# Patient Record
Sex: Male | Born: 1960
Health system: Southern US, Community
[De-identification: ages and names within clinical notes are randomized; demographics above are authoritative.]

## PROBLEM LIST (undated history)

## (undated) DIAGNOSIS — T7840XA Allergy, unspecified, initial encounter: Secondary | ICD-10-CM

## (undated) DIAGNOSIS — G4730 Sleep apnea, unspecified: Secondary | ICD-10-CM

## (undated) DIAGNOSIS — K219 Gastro-esophageal reflux disease without esophagitis: Secondary | ICD-10-CM

## (undated) DIAGNOSIS — M1990 Unspecified osteoarthritis, unspecified site: Secondary | ICD-10-CM

## (undated) DIAGNOSIS — I10 Essential (primary) hypertension: Secondary | ICD-10-CM

## (undated) DIAGNOSIS — Z8709 Personal history of other diseases of the respiratory system: Secondary | ICD-10-CM

## (undated) DIAGNOSIS — F419 Anxiety disorder, unspecified: Secondary | ICD-10-CM

## (undated) DIAGNOSIS — F32A Depression, unspecified: Secondary | ICD-10-CM

## (undated) DIAGNOSIS — R0600 Dyspnea, unspecified: Secondary | ICD-10-CM

## (undated) HISTORY — PX: TONSILLECTOMY: SUR1361

## (undated) HISTORY — PX: JOINT REPLACEMENT: SHX530

## (undated) HISTORY — PX: LUMBAR FUSION: SHX111

## (undated) HISTORY — PX: BACK SURGERY: SHX140

## (undated) HISTORY — DX: Allergy, unspecified, initial encounter: T78.40XA

## (undated) HISTORY — DX: Gastro-esophageal reflux disease without esophagitis: K21.9

## (undated) HISTORY — DX: Depression, unspecified: F32.A

---
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
## 2008-07-24 ENCOUNTER — Encounter: Admission: RE | Admit: 2008-07-24 | Discharge: 2008-07-24 | Payer: Self-pay | Admitting: Neurosurgery

## 2008-09-04 ENCOUNTER — Ambulatory Visit (HOSPITAL_COMMUNITY): Admission: RE | Admit: 2008-09-04 | Discharge: 2008-09-05 | Payer: Self-pay | Admitting: Neurosurgery

## 2009-01-05 ENCOUNTER — Encounter: Admission: RE | Admit: 2009-01-05 | Discharge: 2009-01-05 | Payer: Self-pay | Admitting: Neurosurgery

## 2010-05-03 LAB — BASIC METABOLIC PANEL
BUN: 15 mg/dL (ref 6–23)
CO2: 27 mEq/L (ref 19–32)
Calcium: 10.3 mg/dL (ref 8.4–10.5)
Chloride: 106 mEq/L (ref 96–112)
Creatinine, Ser: 1.13 mg/dL (ref 0.4–1.5)
GFR calc non Af Amer: >60 mL/min (ref >60)
Glucose, Bld: 110 mg/dL — ABNORMAL HIGH (ref 70–99)
Potassium: 4.8 mEq/L (ref 3.5–5.1)
Sodium: 140 mEq/L (ref 135–145)

## 2010-05-03 LAB — CBC
HCT: 45.9 % (ref 39.0–52.0)
Hemoglobin: 15.8 g/dL (ref 13.0–17.0)
MCHC: 34.5 g/dL (ref 30.0–36.0)
MCV: 97.4 fL (ref 78.0–100.0)
Platelets: 213 10*3/uL (ref 150–400)
RBC: 4.71 MIL/uL (ref 4.22–5.81)
RDW: 13.5 % (ref 11.5–15.5)
WBC: 7.4 10*3/uL (ref 4.0–10.5)

## 2010-06-09 NOTE — Op Note (Signed)
Barry Roberson, Barry Roberson               ACCOUNT NO.:  [id number]   MEDICAL RECORD NO.:  [id number]          PATIENT TYPE:  OIB   LOCATION:  3536                         FACILITY:  [facility]   PHYSICIAN:  Hewitt Shorts, M.D.DATE OF BIRTH:  25-Sep-1960   DATE OF PROCEDURE:  09/04/2008  DATE OF DISCHARGE:                               OPERATIVE REPORT   PREOPERATIVE DIAGNOSES:  Left L5-S1 lumbar disk herniation, lumbar  degenerative disk disease, lumbar spondylosis, and lumbar radiculopathy.   POSTOPERATIVE DIAGNOSES:  Left L5-S1 lumbar disk herniation, lumbar  degenerative disk disease, lumbar spondylosis and lumbar radiculopathy.   PROCEDURE:  Left L5-S1 lumbar laminotomy and microdiskectomy with  microdissection.   SURGEON:  Hewitt Shorts, M.D.   ASSISTANT:  Nelia Shi. Webb Silversmith, RN and Clydene Fake, M.D.   ANESTHESIA:  General endotracheal.   INDICATION:  The patient is a 50 year old man who presented with a left  lumbar radiculopathy found to be secondary to a left L5-S1 lumbar disk  herniation, superimposed upon underlying degenerative disk disease and  spondylosis.  A decision was made to proceed with elective laminotomy  and microdiskectomy.   PROCEDURE:  The patient was brought to the operating room and placed  under general endotracheal anesthesia.  The patient was turned to a  prone position.  Lumbar region was prepped with Betadine soap and  solution and draped in a sterile fashion.  The midline was infiltrated  with local anesthetic with epinephrine and x-ray was taken.  The L5-S1  level identified and a midline incision was made over the L5-S1 level  and carried down through the subcutaneous tissue.  Bipolar cautery and  electrocautery were used to maintain hemostasis.  Dissection was carried  down to the lumbar fascia, which was incised on the left side of the  midline.  The paraspinal muscles were dissected from the spinous process  and lamina in a subperiosteal  fashion.  A self-retaining retractor was  placed and the L5-S1 interlaminar space was identified and an x-ray was  taken to confirm the localization.   The operating microscope was then draped and brought to the field to  provide additional navigation, illumination, and visualization, and the  remainder of the decompression was performed using microdissection and  microsurgical technique.   Laminotomy was performed using the Stryker high-speed drill along with  Kerrison punches.  The ligamentum flavum was carefully removed and we  identified the thecal sac and left S1 nerve root.  A foraminotomy was  performed through the left S1 nerve root and then we were able to gently  retract the thecal sac and nerve root immediately.  A disk herniation  was found with a fragment that had herniated caudally behind the body of  S1.  This was mobilized and removed and the rent in the annulus through  which it had herniated was identified.  We then further opened the  annulus and entered into the disk space and continued the diskectomy,  continuing to remove extensive degenerated disk material.  A thorough  diskectomy was performed removing all loose disk material from both the  disk space and the epidural space, and good decompression of the thecal  sac and nerve root was achieved.  Once decompression was completed,  hemostasis was established with a use of bipolar cautery and then we  instilled 2 mL of fentanyl and 80 mg of Depo-Medrol into the epidural  space and proceeded with closure.  Lumbar fascia was closed with  interrupted undyed #1 Vicryl sutures.  The subcutaneous and subcuticular  were closed with interrupted inverted 2-0 undyed Vicryl sutures.  The  skin was approximated with Dermabond.  The procedure was tolerated well.  The estimated blood loss was 50 mL.  Sponge and needle count were  correct.  Following surgery, the patient was returned back to the supine  position to be reversed  from the anesthetic, extubated, and transferred  to the recovery room for further care.      Hewitt Shorts, M.D.  Electronically Signed     RWN/MEDQ  D:  09/04/2008  T:  09/05/2008  Job:  [id number]

## 2011-03-30 ENCOUNTER — Emergency Department (HOSPITAL_COMMUNITY): Admission: EM | Admit: 2011-03-30 | Discharge: 2011-03-30 | Discharge status: Against Medical Advice | Payer: Self-pay

## 2015-09-07 NOTE — H&P (Signed)
TOTAL HIP ADMISSION H&P  Patient is admitted for left total hip arthroplasty, anterior approach.  Subjective:  Chief Complaint: Left hip primary OA / pain  HPI: Barry Roberson, 55 y.o. male, has a history of pain and functional disability in the left hip(s) due to arthritis and patient has failed non-surgical conservative treatments for greater than 12 weeks to include NSAID's and/or analgesics, corticosteriod injections and activity modification.  Onset of symptoms was gradual starting 3-4 years ago with gradually worsening course since that time.The patient noted no past surgery on the left hip(s).  Patient currently rates pain in the left hip at 9 out of 10 with activity. Patient has night pain, worsening of pain with activity and weight bearing, trendelenberg gait, pain that interfers with activities of daily living and pain with passive range of motion. Patient has evidence of periarticular osteophytes and joint space narrowing by imaging studies. This condition presents safety issues increasing the risk of falls. There is no current active infection.   Risks, benefits and expectations were discussed with the patient.  Risks including but not limited to the risk of anesthesia, blood clots, nerve damage, blood vessel damage, failure of the prosthesis, infection and up to and including death.  Patient understand the risks, benefits and expectations and wishes to proceed with surgery.   PCP: No PCP Per Patient  D/C Plans:      Home  Post-op Meds:       No Rx given  Tranexamic Acid:      To be given - IV   Decadron:      Is to be given  FYI:     ASA  Norco      Past Medical History:  Diagnosis Date  . History of bronchitis    10 years ago     Past Surgical History:  Procedure Laterality Date  . BACK SURGERY     08/2008 secondary to ruptured L3   . TONSILLECTOMY      No prescriptions prior to admission.   Not on File   Social History  Substance Use Topics  . Smoking status:  Current Every Day Smoker    Packs/day: 0.50    Years: 30.00    Types: Cigarettes  . Smokeless tobacco: Never Used  . Alcohol use No       Review of Systems  Constitutional: Negative.   HENT: Negative.   Eyes: Negative.   Respiratory: Negative.   Cardiovascular: Negative.   Gastrointestinal: Negative.   Genitourinary: Negative.   Musculoskeletal: Positive for joint pain.  Skin: Negative.   Neurological: Negative.   Endo/Heme/Allergies: Negative.   Psychiatric/Behavioral: Negative.     Objective:  Physical Exam  Constitutional: He is oriented to person, place, and time. He appears well-developed.  HENT:  Head: Normocephalic.  Eyes: Pupils are equal, round, and reactive to light.  Neck: Neck supple. No JVD present. No tracheal deviation present. No thyromegaly present.  Cardiovascular: Normal rate, regular rhythm, normal heart sounds and intact distal pulses.   Respiratory: Effort normal and breath sounds normal. No respiratory distress. He has no wheezes.  GI: Soft. There is no tenderness. There is no guarding.  Musculoskeletal:       Left hip: He exhibits decreased range of motion, decreased strength, tenderness and bony tenderness. He exhibits no swelling, no deformity and no laceration.  Lymphadenopathy:    He has no cervical adenopathy.  Neurological: He is alert and oriented to person, place, and time.  Skin: Skin is  warm and dry.  Psychiatric: He has a normal mood and affect.      Imaging Review Plain radiographs demonstrate severe degenerative joint disease of the left hip(s). The bone quality appears to be good for age and reported activity level.  Assessment/Plan:  End stage arthritis, left hip(s)  The patient history, physical examination, clinical judgement of the provider and imaging studies are consistent with end stage degenerative joint disease of the left hip(s) and total hip arthroplasty is deemed medically necessary. The treatment options including  medical management, injection therapy, arthroscopy and arthroplasty were discussed at length. The risks and benefits of total hip arthroplasty were presented and reviewed. The risks due to aseptic loosening, infection, stiffness, dislocation/subluxation,  thromboembolic complications and other imponderables were discussed.  The patient acknowledged the explanation, agreed to proceed with the plan and consent was signed. Patient is being admitted for inpatient treatment for surgery, pain control, PT, OT, prophylactic antibiotics, VTE prophylaxis, progressive ambulation and ADL's and discharge planning.The patient is planning to be discharged home.    [place] Pugh Nabila Albarracin   PA-C  09/11/2015, 7:42 AM

## 2015-09-10 ENCOUNTER — Encounter (HOSPITAL_COMMUNITY)
Admission: RE | Admit: 2015-09-10 | Discharge: 2015-09-10 | Disposition: A | Discharge status: Home / Self Care | Payer: Commercial Managed Care - HMO | Source: Ambulatory Visit | Attending: Orthopedic Surgery | Admitting: Orthopedic Surgery

## 2015-09-10 ENCOUNTER — Encounter (HOSPITAL_COMMUNITY): Payer: Self-pay

## 2015-09-10 DIAGNOSIS — M1612 Unilateral primary osteoarthritis, left hip: Secondary | ICD-10-CM | POA: Diagnosis not present

## 2015-09-10 DIAGNOSIS — Z01812 Encounter for preprocedural laboratory examination: Secondary | ICD-10-CM | POA: Diagnosis not present

## 2015-09-10 DIAGNOSIS — Z0183 Encounter for blood typing: Secondary | ICD-10-CM | POA: Diagnosis not present

## 2015-09-10 HISTORY — DX: Personal history of other diseases of the respiratory system: Z87.09

## 2015-09-10 LAB — ABO/RH: ABO/RH(D): O POS

## 2015-09-10 LAB — BASIC METABOLIC PANEL
ANION GAP: 6 (ref 5–15)
BUN: 19 mg/dL (ref 6–20)
CHLORIDE: 106 mmol/L (ref 101–111)
CO2: 26 mmol/L (ref 22–32)
CREATININE: 1.09 mg/dL (ref 0.61–1.24)
Calcium: 9.2 mg/dL (ref 8.9–10.3)
GFR calc non Af Amer: >60 mL/min (ref >60)
Glucose, Bld: 99 mg/dL (ref 65–99)
POTASSIUM: 4.7 mmol/L (ref 3.5–5.1)
SODIUM: 138 mmol/L (ref 135–145)

## 2015-09-10 LAB — SURGICAL PCR SCREEN
MRSA, PCR: NEGATIVE
Staphylococcus aureus: NEGATIVE

## 2015-09-10 LAB — CBC
HEMATOCRIT: 41.6 % (ref 39.0–52.0)
HEMOGLOBIN: 14.2 g/dL (ref 13.0–17.0)
MCH: 32.2 pg (ref 26.0–34.0)
MCHC: 34.1 g/dL (ref 30.0–36.0)
MCV: 94.3 fL (ref 78.0–100.0)
PLATELETS: 193 10*3/uL (ref 150–400)
RBC: 4.41 MIL/uL (ref 4.22–5.81)
RDW: 13 % (ref 11.5–15.5)
WBC: 6.1 10*3/uL (ref 4.0–10.5)

## 2015-09-10 LAB — PROTIME-INR
INR: 0.93
Prothrombin Time: 12.5 seconds (ref 11.4–15.2)

## 2015-09-10 NOTE — Patient Instructions (Signed)
Barry Roberson  09/10/2015   Your procedure is scheduled on: Tuesday September 16, 2015  Report to [facility]  Entrance take [place]  elevators to 3rd floor to  [place] at 8:30 AM.  Call this number if you have problems the morning of surgery [phone]   Remember: ONLY 1 PERSON MAY GO WITH YOU TO SHORT STAY TO GET  READY MORNING OF [place].  Do not eat food or drink liquids :After Midnight.     Take these medicines the morning of surgery with A SIP OF WATER: NONE                               You may not have any metal on your body including hair pins and              piercings  Do not wear jewelry, lotions, powders or colognes, deodorant                         Men may shave face and neck.   Do not bring valuables to the hospital. [place].  Contacts, dentures or bridgework may not be worn into surgery.  Leave suitcase in the car. After surgery it may be brought to your room.    Special Instructions: NO SMOKING 24 HOURS PRIOR TO SURGICAL PROCEDURE DATE              Please read over the following fact sheets you were given:MRSA INFORMATION SHEET; INCENTIVE SPIROMETER; BLOOD TRANSFUSION INFORMATION SHEET  _____________________________________________________________________             [facility] - Preparing for Surgery Before surgery, you can play an important role.  Because skin is not sterile, your skin needs to be as free of germs as possible.  You can reduce the number of germs on your skin by washing with CHG (chlorahexidine gluconate) soap before surgery.  CHG is an antiseptic cleaner which kills germs and bonds with the skin to continue killing germs even after washing. Please DO NOT use if you have an allergy to CHG or antibacterial soaps.  If your skin becomes reddened/irritated stop using the CHG and inform your nurse when you arrive at Short Stay. Do not shave (including legs and  underarms) for at least 48 hours prior to the first CHG shower.  You may shave your face/neck. Please follow these instructions carefully:  1.  Shower with CHG Soap the night before surgery and the  morning of Surgery.  2.  If you choose to wash your hair, wash your hair first as usual with your  normal  shampoo.  3.  After you shampoo, rinse your hair and body thoroughly to remove the  shampoo.                           4.  Use CHG as you would any other liquid soap.  You can apply chg directly  to the skin and wash                       Gently with a scrungie or clean washcloth.  5.  Apply the CHG Soap to your body ONLY FROM THE NECK DOWN.   Do not use on face/ open                           Wound or open sores. Avoid contact with eyes, ears mouth and genitals (private parts).                       Wash face,  Genitals (private parts) with your normal soap.             6.  Wash thoroughly, paying special attention to the area where your surgery  will be performed.  7.  Thoroughly rinse your body with warm water from the neck down.  8.  DO NOT shower/wash with your normal soap after using and rinsing off  the CHG Soap.                9.  Pat yourself dry with a clean towel.            10.  Wear clean pajamas.            11.  Place clean sheets on your bed the night of your first shower and do not  sleep with pets. Day of Surgery : Do not apply any lotions/deodorants the morning of surgery.  Please wear clean clothes to the hospital/surgery center.  FAILURE TO FOLLOW THESE INSTRUCTIONS MAY RESULT IN THE CANCELLATION OF YOUR SURGERY PATIENT SIGNATURE_________________________________  NURSE SIGNATURE__________________________________  ________________________________________________________________________   Adam Phenix  An incentive spirometer is a tool that can help keep your lungs clear and active. This tool measures how well you are filling your lungs with each breath. Taking  long deep breaths may help reverse or decrease the chance of developing breathing (pulmonary) problems (especially infection) following:  A long period of time when you are unable to move or be active. BEFORE THE PROCEDURE   If the spirometer includes an indicator to show your best effort, your nurse or respiratory therapist will set it to a desired goal.  If possible, sit up straight or lean slightly forward. Try not to slouch.  Hold the incentive spirometer in an upright position. INSTRUCTIONS FOR USE  1. Sit on the edge of your bed if possible, or sit up as far as you can in bed or on a chair. 2. Hold the incentive spirometer in an upright position. 3. Breathe out normally. 4. Place the mouthpiece in your mouth and seal your lips tightly around it. 5. Breathe in slowly and as deeply as possible, raising the piston or the ball toward the top of the column. 6. Hold your breath for 3-5 seconds or for as long as possible. Allow the piston or ball to fall to the bottom of the column. 7. Remove the mouthpiece from your mouth and breathe out normally. 8. Rest for a few seconds and repeat Steps 1 through 7 at least 10 times every 1-2 hours when you are awake. Take your time and take a few normal breaths between deep breaths. 9. The spirometer may include an indicator to show your best effort. Use the indicator as a goal to work toward during each repetition. 10. After each set of 10 deep breaths, practice coughing to be sure your lungs are clear. If you have an incision (the cut made at the time of surgery), support your incision when coughing by placing a pillow or  rolled up towels firmly against it. Once you are able to get out of bed, walk around indoors and cough well. You may stop using the incentive spirometer when instructed by your caregiver.  RISKS AND COMPLICATIONS  Take your time so you do not get dizzy or light-headed.  If you are in pain, you may need to take or ask for pain  medication before doing incentive spirometry. It is harder to take a deep breath if you are having pain. AFTER USE  Rest and breathe slowly and easily.  It can be helpful to keep track of a log of your progress. Your caregiver can provide you with a simple table to help with this. If you are using the spirometer at home, follow these instructions: [place] IF:   You are having difficultly using the spirometer.  You have trouble using the spirometer as often as instructed.  Your pain medication is not giving enough relief while using the spirometer.  You develop fever of 100.5 F (38.1 C) or higher. SEEK IMMEDIATE MEDICAL CARE IF:   You cough up bloody sputum that had not been present before.  You develop fever of 102 F (38.9 C) or greater.  You develop worsening pain at or near the incision site. MAKE SURE YOU:   Understand these instructions.  Will watch your condition.  Will get help right away if you are not doing well or get worse. Document Released: 05/24/2006 Document Revised: 04/05/2011 Document Reviewed: 07/25/2006 Exit[employer] Patient Information 2014 [employer], [place].   ________________________________________________________________________  WHAT IS A BLOOD TRANSFUSION? Blood Transfusion Information  A transfusion is the replacement of blood or some of its parts. Blood is made up of multiple cells which provide different functions.  Red blood cells carry oxygen and are used for blood loss replacement.  White blood cells fight against infection.  Platelets control bleeding.  Plasma helps clot blood.  Other blood products are available for specialized needs, such as hemophilia or other clotting disorders. BEFORE THE TRANSFUSION  Who gives blood for transfusions?   Healthy volunteers who are fully evaluated to make sure their blood is safe. This is blood bank blood. Transfusion therapy is the safest it has ever been in the practice of medicine.  Before blood is taken from a donor, a complete history is taken to make sure that person has no history of diseases nor engages in risky social behavior (examples are intravenous drug use or sexual activity with multiple partners). The donor's travel history is screened to minimize risk of transmitting infections, such as malaria. The donated blood is tested for signs of infectious diseases, such as HIV and hepatitis. The blood is then tested to be sure it is compatible with you in order to minimize the chance of a transfusion reaction. If you or a relative donates blood, this is often done in anticipation of surgery and is not appropriate for emergency situations. It takes many days to process the donated blood. RISKS AND COMPLICATIONS Although transfusion therapy is very safe and saves many lives, the main dangers of transfusion include:   Getting an infectious disease.  Developing a transfusion reaction. This is an allergic reaction to something in the blood you were given. Every precaution is taken to prevent this. The decision to have a blood transfusion has been considered carefully by your caregiver before blood is given. Blood is not given unless the benefits outweigh the risks. AFTER THE TRANSFUSION  Right after receiving a blood transfusion, you will usually  feel much better and more energetic. This is especially true if your red blood cells have gotten low (anemic). The transfusion raises the level of the red blood cells which carry oxygen, and this usually causes an energy increase.  The nurse administering the transfusion will monitor you carefully for complications. HOME CARE INSTRUCTIONS  No special instructions are needed after a transfusion. You may find your energy is better. Speak with your caregiver about any limitations on activity for underlying diseases you may have. SEEK MEDICAL CARE IF:   Your condition is not improving after your transfusion.  You develop redness or  irritation at the intravenous (IV) site. SEEK IMMEDIATE MEDICAL CARE IF:  Any of the following symptoms occur over the next 12 hours:  Shaking chills.  You have a temperature by mouth above 102 F (38.9 C), not controlled by medicine.  Chest, back, or muscle pain.  People around you feel you are not acting correctly or are confused.  Shortness of breath or difficulty breathing.  Dizziness and fainting.  You get a rash or develop hives.  You have a decrease in urine output.  Your urine turns a dark color or changes to pink, red, or brown. Any of the following symptoms occur over the next 10 days:  You have a temperature by mouth above 102 F (38.9 C), not controlled by medicine.  Shortness of breath.  Weakness after normal activity.  The white part of the eye turns yellow (jaundice).  You have a decrease in the amount of urine or are urinating less often.  Your urine turns a dark color or changes to pink, red, or brown. Document Released: 01/09/2000 Document Revised: 04/05/2011 Document Reviewed: 08/28/2007 [facility] Patient Information 2014 [place], [place].  _______________________________________________________________________

## 2015-09-16 ENCOUNTER — Inpatient Hospital Stay (HOSPITAL_COMMUNITY): Payer: Commercial Managed Care - HMO | Admitting: Anesthesiology

## 2015-09-16 ENCOUNTER — Encounter (HOSPITAL_COMMUNITY)
Admission: RE | Disposition: A | Discharge status: Home Health | Payer: Self-pay | Source: Ambulatory Visit | Attending: Orthopedic Surgery

## 2015-09-16 ENCOUNTER — Inpatient Hospital Stay (HOSPITAL_COMMUNITY): Payer: Commercial Managed Care - HMO

## 2015-09-16 ENCOUNTER — Encounter (HOSPITAL_COMMUNITY): Payer: Self-pay | Admitting: *Deleted

## 2015-09-16 ENCOUNTER — Inpatient Hospital Stay (HOSPITAL_COMMUNITY)
Admission: RE | Admit: 2015-09-16 | Discharge: 2015-09-17 | DRG: 470 | Disposition: A | Discharge status: Home Health | Payer: Commercial Managed Care - HMO | Source: Ambulatory Visit | Attending: Orthopedic Surgery | Admitting: Orthopedic Surgery

## 2015-09-16 DIAGNOSIS — Z96642 Presence of left artificial hip joint: Secondary | ICD-10-CM

## 2015-09-16 DIAGNOSIS — F17210 Nicotine dependence, cigarettes, uncomplicated: Secondary | ICD-10-CM | POA: Diagnosis present

## 2015-09-16 DIAGNOSIS — M1612 Unilateral primary osteoarthritis, left hip: Principal | ICD-10-CM | POA: Diagnosis present

## 2015-09-16 DIAGNOSIS — M25552 Pain in left hip: Secondary | ICD-10-CM | POA: Diagnosis present

## 2015-09-16 HISTORY — PX: TOTAL HIP ARTHROPLASTY: SHX124

## 2015-09-16 LAB — TYPE AND SCREEN
ABO/RH(D): O POS
Antibody Screen: NEGATIVE

## 2015-09-16 SURGERY — ARTHROPLASTY, HIP, TOTAL, ANTERIOR APPROACH
Anesthesia: Spinal | Site: Hip | Laterality: Left

## 2015-09-16 MED ORDER — SODIUM CHLORIDE 0.9 % IV SOLN
INTRAVENOUS | Status: DC
  Administered 2015-09-16 – 2015-09-17 (×2): via INTRAVENOUS

## 2015-09-16 MED ORDER — ONDANSETRON HCL 4 MG/2ML IJ SOLN
INTRAMUSCULAR | Status: AC
  Filled 2015-09-16: qty 2

## 2015-09-16 MED ORDER — DEXAMETHASONE SODIUM PHOSPHATE 10 MG/ML IJ SOLN
10.0000 mg | Freq: Once | INTRAMUSCULAR | Status: AC
  Administered 2015-09-16: 10 mg via INTRAVENOUS

## 2015-09-16 MED ORDER — HYDROCODONE-ACETAMINOPHEN 7.5-325 MG PO TABS
1.0000 | ORAL_TABLET | ORAL | Status: DC | PRN
  Administered 2015-09-16: 1 via ORAL
  Administered 2015-09-16: 2 via ORAL
  Administered 2015-09-16: 1 via ORAL
  Administered 2015-09-17 (×4): 2 via ORAL
  Filled 2015-09-16 (×3): qty 2
  Filled 2015-09-16: qty 1
  Filled 2015-09-16 (×2): qty 2
  Filled 2015-09-16: qty 1
  Filled 2015-09-16: qty 2

## 2015-09-16 MED ORDER — OXYCODONE HCL 5 MG PO TABS: 5.0000 mg | ORAL_TABLET | Freq: Once | ORAL | Status: DC | PRN

## 2015-09-16 MED ORDER — HYDROMORPHONE HCL 1 MG/ML IJ SOLN
0.5000 mg | INTRAMUSCULAR | Status: DC | PRN
  Administered 2015-09-16: 1 mg via INTRAVENOUS
  Filled 2015-09-16 (×2): qty 1

## 2015-09-16 MED ORDER — CEFAZOLIN IN D5W 1 GM/50ML IV SOLN
1.0000 g | Freq: Four times a day (QID) | INTRAVENOUS | Status: AC
  Administered 2015-09-16 – 2015-09-17 (×2): 1 g via INTRAVENOUS
  Filled 2015-09-16 (×2): qty 50

## 2015-09-16 MED ORDER — ASPIRIN 81 MG PO CHEW: 81.0000 mg | CHEWABLE_TABLET | Freq: Two times a day (BID) | ORAL | 0 refills | Status: DC

## 2015-09-16 MED ORDER — CEFAZOLIN SODIUM-DEXTROSE 2-4 GM/100ML-% IV SOLN
INTRAVENOUS | Status: AC
  Filled 2015-09-16: qty 100

## 2015-09-16 MED ORDER — FENTANYL CITRATE (PF) 100 MCG/2ML IJ SOLN
INTRAMUSCULAR | Status: DC | PRN
  Administered 2015-09-16: 50 ug via INTRAVENOUS

## 2015-09-16 MED ORDER — DIPHENHYDRAMINE HCL 12.5 MG/5ML PO ELIX: 12.5000 mg | ORAL_SOLUTION | ORAL | Status: DC | PRN

## 2015-09-16 MED ORDER — POLYETHYLENE GLYCOL 3350 17 G PO PACK
17.0000 g | PACK | Freq: Two times a day (BID) | ORAL | Status: DC
  Administered 2015-09-16 – 2015-09-17 (×2): 17 g via ORAL
  Filled 2015-09-16 (×2): qty 1

## 2015-09-16 MED ORDER — ALUM & MAG HYDROXIDE-SIMETH 200-200-20 MG/5ML PO SUSP: 30.0000 mL | ORAL | Status: DC | PRN

## 2015-09-16 MED ORDER — HYDROMORPHONE HCL 1 MG/ML IJ SOLN
INTRAMUSCULAR | Status: AC
  Filled 2015-09-16: qty 1

## 2015-09-16 MED ORDER — METHOCARBAMOL 500 MG PO TABS: 500.0000 mg | ORAL_TABLET | Freq: Four times a day (QID) | ORAL | 0 refills | Status: DC | PRN

## 2015-09-16 MED ORDER — MIDAZOLAM HCL 5 MG/5ML IJ SOLN
INTRAMUSCULAR | Status: DC | PRN
  Administered 2015-09-16: 2 mg via INTRAVENOUS

## 2015-09-16 MED ORDER — FERROUS SULFATE 325 (65 FE) MG PO TABS
325.0000 mg | ORAL_TABLET | Freq: Two times a day (BID) | ORAL | Status: DC
  Administered 2015-09-16 – 2015-09-17 (×2): 325 mg via ORAL
  Filled 2015-09-16 (×2): qty 1

## 2015-09-16 MED ORDER — DEXTROSE 5 % IV SOLN
INTRAVENOUS | Status: DC | PRN
  Administered 2015-09-16: 50 ug/min via INTRAVENOUS

## 2015-09-16 MED ORDER — HYDROCODONE-ACETAMINOPHEN 7.5-325 MG PO TABS: 1.0000 | ORAL_TABLET | ORAL | 0 refills | Status: DC | PRN

## 2015-09-16 MED ORDER — PROPOFOL 10 MG/ML IV BOLUS
INTRAVENOUS | Status: DC | PRN
  Administered 2015-09-16: 30 mg via INTRAVENOUS

## 2015-09-16 MED ORDER — ASPIRIN 81 MG PO CHEW
81.0000 mg | CHEWABLE_TABLET | Freq: Two times a day (BID) | ORAL | Status: DC
  Administered 2015-09-16 – 2015-09-17 (×2): 81 mg via ORAL
  Filled 2015-09-16 (×2): qty 1

## 2015-09-16 MED ORDER — METHOCARBAMOL 1000 MG/10ML IJ SOLN
500.0000 mg | Freq: Four times a day (QID) | INTRAVENOUS | Status: DC | PRN
  Administered 2015-09-16: 500 mg via INTRAVENOUS
  Filled 2015-09-16: qty 550
  Filled 2015-09-16: qty 5

## 2015-09-16 MED ORDER — OXYCODONE HCL 5 MG/5ML PO SOLN
5.0000 mg | Freq: Once | ORAL | Status: DC | PRN
  Filled 2015-09-16: qty 5

## 2015-09-16 MED ORDER — PHENOL 1.4 % MT LIQD: 1.0000 | OROMUCOSAL | Status: DC | PRN

## 2015-09-16 MED ORDER — PROMETHAZINE HCL 25 MG/ML IJ SOLN: 6.2500 mg | INTRAMUSCULAR | Status: DC | PRN

## 2015-09-16 MED ORDER — HYDROMORPHONE HCL 1 MG/ML IJ SOLN
0.2500 mg | INTRAMUSCULAR | Status: DC | PRN
  Administered 2015-09-16 (×2): 0.5 mg via INTRAVENOUS

## 2015-09-16 MED ORDER — SODIUM CHLORIDE 0.9 % IR SOLN
Status: DC | PRN
  Administered 2015-09-16: 1000 mL

## 2015-09-16 MED ORDER — PHENYLEPHRINE HCL 10 MG/ML IJ SOLN: 30.0000 ug/min | INTRAVENOUS | Status: DC

## 2015-09-16 MED ORDER — DOCUSATE SODIUM 100 MG PO CAPS
100.0000 mg | ORAL_CAPSULE | Freq: Two times a day (BID) | ORAL | Status: DC
  Administered 2015-09-16 – 2015-09-17 (×2): 100 mg via ORAL
  Filled 2015-09-16 (×2): qty 1

## 2015-09-16 MED ORDER — CEFAZOLIN SODIUM-DEXTROSE 2-4 GM/100ML-% IV SOLN
2.0000 g | INTRAVENOUS | Status: AC
  Administered 2015-09-16: 2 g via INTRAVENOUS
  Filled 2015-09-16: qty 100

## 2015-09-16 MED ORDER — PROPOFOL 10 MG/ML IV BOLUS
INTRAVENOUS | Status: AC
  Filled 2015-09-16: qty 20

## 2015-09-16 MED ORDER — PROPOFOL 10 MG/ML IV BOLUS
INTRAVENOUS | Status: AC
  Filled 2015-09-16: qty 40

## 2015-09-16 MED ORDER — ACETAMINOPHEN 325 MG PO TABS: 650.0000 mg | ORAL_TABLET | Freq: Four times a day (QID) | ORAL | Status: DC | PRN

## 2015-09-16 MED ORDER — FENTANYL CITRATE (PF) 100 MCG/2ML IJ SOLN
INTRAMUSCULAR | Status: AC
Start: 2015-09-16 — End: 2015-09-16
  Filled 2015-09-16: qty 2

## 2015-09-16 MED ORDER — PROPOFOL 500 MG/50ML IV EMUL
INTRAVENOUS | Status: DC | PRN
  Administered 2015-09-16: 75 ug/kg/min via INTRAVENOUS

## 2015-09-16 MED ORDER — LACTATED RINGERS IV SOLN
INTRAVENOUS | Status: DC
  Administered 2015-09-16: 13:00:00 via INTRAVENOUS
  Administered 2015-09-16: 1000 mL via INTRAVENOUS

## 2015-09-16 MED ORDER — BUPIVACAINE IN DEXTROSE 0.75-8.25 % IT SOLN
INTRATHECAL | Status: DC | PRN
  Administered 2015-09-16: 2 mL via INTRATHECAL

## 2015-09-16 MED ORDER — METOCLOPRAMIDE HCL 5 MG/ML IJ SOLN: 5.0000 mg | Freq: Three times a day (TID) | INTRAMUSCULAR | Status: DC | PRN

## 2015-09-16 MED ORDER — METHOCARBAMOL 500 MG PO TABS: 500.0000 mg | ORAL_TABLET | Freq: Four times a day (QID) | ORAL | Status: DC | PRN

## 2015-09-16 MED ORDER — ONDANSETRON HCL 4 MG/2ML IJ SOLN: 4.0000 mg | Freq: Four times a day (QID) | INTRAMUSCULAR | Status: DC | PRN

## 2015-09-16 MED ORDER — MENTHOL 3 MG MT LOZG: 1.0000 | LOZENGE | OROMUCOSAL | Status: DC | PRN

## 2015-09-16 MED ORDER — MIDAZOLAM HCL 2 MG/2ML IJ SOLN
INTRAMUSCULAR | Status: AC
  Filled 2015-09-16: qty 2

## 2015-09-16 MED ORDER — CHLORHEXIDINE GLUCONATE 4 % EX LIQD: 60.0000 mL | Freq: Once | CUTANEOUS | Status: DC

## 2015-09-16 MED ORDER — ACETAMINOPHEN 650 MG RE SUPP: 650.0000 mg | Freq: Four times a day (QID) | RECTAL | Status: DC | PRN

## 2015-09-16 MED ORDER — DEXAMETHASONE SODIUM PHOSPHATE 10 MG/ML IJ SOLN
10.0000 mg | Freq: Once | INTRAMUSCULAR | Status: AC
  Administered 2015-09-17: 10 mg via INTRAVENOUS
  Filled 2015-09-16: qty 1

## 2015-09-16 MED ORDER — DEXAMETHASONE SODIUM PHOSPHATE 10 MG/ML IJ SOLN
INTRAMUSCULAR | Status: AC
  Filled 2015-09-16: qty 1

## 2015-09-16 MED ORDER — TRANEXAMIC ACID 1000 MG/10ML IV SOLN
1000.0000 mg | INTRAVENOUS | Status: AC
  Administered 2015-09-16: 1000 mg via INTRAVENOUS
  Filled 2015-09-16: qty 1100

## 2015-09-16 MED ORDER — METOCLOPRAMIDE HCL 5 MG PO TABS: 5.0000 mg | ORAL_TABLET | Freq: Three times a day (TID) | ORAL | Status: DC | PRN

## 2015-09-16 MED ORDER — ONDANSETRON HCL 4 MG PO TABS: 4.0000 mg | ORAL_TABLET | Freq: Four times a day (QID) | ORAL | Status: DC | PRN

## 2015-09-16 SURGICAL SUPPLY — 37 items
BAG DECANTER FOR FLEXI CONT (MISCELLANEOUS) IMPLANT
BAG SPEC THK2 15X12 ZIP CLS (MISCELLANEOUS)
BAG ZIPLOCK 12X15 (MISCELLANEOUS) IMPLANT
CAPT HIP TOTAL 2 ×2 IMPLANT
CLOTH BEACON ORANGE TIMEOUT ST (SAFETY) ×2 IMPLANT
COVER PERINEAL POST (MISCELLANEOUS) ×2 IMPLANT
DRAPE STERI IOBAN 125X83 (DRAPES) ×2 IMPLANT
DRAPE U-SHAPE 47X51 STRL (DRAPES) ×4 IMPLANT
DRESSING AQUACEL AG SP 3.5X10 (GAUZE/BANDAGES/DRESSINGS) ×1 IMPLANT
DRSG AQUACEL AG ADV 3.5X10 (GAUZE/BANDAGES/DRESSINGS) ×2 IMPLANT
DRSG AQUACEL AG SP 3.5X10 (GAUZE/BANDAGES/DRESSINGS) ×2
DURAPREP 26ML APPLICATOR (WOUND CARE) ×2 IMPLANT
ELECT REM PT RETURN 15FT ADLT (MISCELLANEOUS) IMPLANT
ELECT REM PT RETURN 9FT ADLT (ELECTROSURGICAL) ×2
ELECTRODE REM PT RTRN 9FT ADLT (ELECTROSURGICAL) ×1 IMPLANT
GLOVE BIOGEL M STRL SZ7.5 (GLOVE) IMPLANT
GLOVE BIOGEL PI IND STRL 7.5 (GLOVE) ×1 IMPLANT
GLOVE BIOGEL PI IND STRL 8.5 (GLOVE) ×7 IMPLANT
GLOVE BIOGEL PI INDICATOR 7.5 (GLOVE) ×1
GLOVE BIOGEL PI INDICATOR 8.5 (GLOVE) ×7
GLOVE ECLIPSE 8.0 STRL XLNG CF (GLOVE) ×4 IMPLANT
GLOVE ORTHO TXT STRL SZ7.5 (GLOVE) ×4 IMPLANT
GOWN STRL REUS W/TWL LRG LVL3 (GOWN DISPOSABLE) ×2 IMPLANT
GOWN STRL REUS W/TWL XL LVL3 (GOWN DISPOSABLE) ×2 IMPLANT
HOLDER FOLEY CATH W/STRAP (MISCELLANEOUS) ×2 IMPLANT
LIQUID BAND (GAUZE/BANDAGES/DRESSINGS) ×2 IMPLANT
PACK ANTERIOR HIP CUSTOM (KITS) ×2 IMPLANT
SAW OSC TIP CART 19.5X105X1.3 (SAW) ×2 IMPLANT
SUT MNCRL AB 4-0 PS2 18 (SUTURE) ×2 IMPLANT
SUT VIC AB 1 CT1 36 (SUTURE) ×6 IMPLANT
SUT VIC AB 2-0 CT1 27 (SUTURE) ×4
SUT VIC AB 2-0 CT1 TAPERPNT 27 (SUTURE) ×2 IMPLANT
SUT VLOC 180 0 24IN GS25 (SUTURE) ×2 IMPLANT
TRAY FOLEY W/METER SILVER 14FR (SET/KITS/TRAYS/PACK) IMPLANT
TRAY FOLEY W/METER SILVER 16FR (SET/KITS/TRAYS/PACK) ×2 IMPLANT
WATER STERILE IRR 1500ML POUR (IV SOLUTION) ×2 IMPLANT
YANKAUER SUCT BULB TIP 10FT TU (MISCELLANEOUS) IMPLANT

## 2015-09-16 NOTE — Interval H&P Note (Signed)
History and Physical Interval Note:  09/16/2015 10:30 AM  Barry Roberson  has presented today for surgery, with the diagnosis of LEFT HIP OA  The various methods of treatment have been discussed with the patient and family. After consideration of risks, benefits and other options for treatment, the patient has consented to  Procedure(s): LEFT TOTAL HIP ARTHROPLASTY ANTERIOR APPROACH (Left) as a surgical intervention .  The patient's history has been reviewed, patient examined, no change in status, stable for surgery.  I have reviewed the patient's chart and labs.  Questions were answered to the patient's satisfaction.     [name]

## 2015-09-16 NOTE — Anesthesia Postprocedure Evaluation (Signed)
Anesthesia Post Note  Patient: MISTER HUCKLE  Procedure(s) Performed: Procedure(s) (LRB): LEFT TOTAL HIP ARTHROPLASTY ANTERIOR APPROACH (Left)  Patient location during evaluation: PACU Anesthesia Type: Spinal Level of consciousness: oriented and awake and alert Pain management: pain level controlled Vital Signs Assessment: post-procedure vital signs reviewed and stable Respiratory status: spontaneous breathing, respiratory function stable and patient connected to nasal cannula oxygen Cardiovascular status: blood pressure returned to baseline and stable Postop Assessment: no headache and no backache Anesthetic complications: no    Last Vitals:  Vitals:   09/16/15 1327 09/16/15 1330  BP: 102/68 126/70  Pulse: (!) 58 (!) 57  Resp: 14 14  Temp: 36.4 C     Last Pain:  Vitals:   09/16/15 1400  TempSrc:   PainSc: 2                  Zenaida Deed

## 2015-09-16 NOTE — Discharge Instructions (Signed)

## 2015-09-16 NOTE — Op Note (Signed)
NAME:  Barry Roberson                ACCOUNT NO.: [id number]      MEDICAL RECORD NO.: [id number]      FACILITY:  [facility]      PHYSICIAN:  Paralee Cancel D  DATE OF BIRTH:  1960-06-02     DATE OF PROCEDURE:  09/16/2015                                 OPERATIVE REPORT         PREOPERATIVE DIAGNOSIS: Left  hip osteoarthritis.      POSTOPERATIVE DIAGNOSIS:  Left hip osteoarthritis.      PROCEDURE:  Left total hip replacement through an anterior approach   utilizing DePuy THR system, component size 25mm pinnacle cup, a size 36+4 neutral   Altrex liner, a size 4 Hi Tri Lock stem with a 36+1.5 delta ceramic   ball.      SURGEON:  Pietro Cassis. Alvan Dame, M.D.      ASSISTANT:  Molli Barrows, PA-C     ANESTHESIA:  Spinal.      SPECIMENS:  None.      COMPLICATIONS:  None.      BLOOD LOSS:  400 cc     DRAINS:  None.      INDICATION OF THE PROCEDURE:  Barry Roberson is a 55 y.o. male who had   presented to office for evaluation of left hip pain.  Radiographs revealed   progressive degenerative changes with bone-on-bone   articulation to the  hip joint.  The patient had painful limited range of   motion significantly affecting their overall quality of life.  The patient was failing to    respond to conservative measures, and at this point was ready   to proceed with more definitive measures.  The patient has noted progressive   degenerative changes in his hip, progressive problems and dysfunction   with regarding the hip prior to surgery.  Consent was obtained for   benefit of pain relief.  Specific risk of infection, DVT, component   failure, dislocation, need for revision surgery, as well discussion of   the anterior versus posterior approach were reviewed.  Consent was   obtained for benefit of anterior pain relief through an anterior   approach.      PROCEDURE IN DETAIL:  The patient was brought to operative theater.   Once adequate anesthesia, preoperative  antibiotics, 2 gm of Ancef, 1 gm of Tranexamic Acid, and 10 mg of Decadron administered.   The patient was positioned supine on the OSI Hanna table.  Once adequate   padding of boney process was carried out, we had predraped out the hip, and  used fluoroscopy to confirm orientation of the pelvis and position.      The left hip was then prepped and draped from proximal iliac crest to   mid thigh with shower curtain technique.      Time-out was performed identifying the patient, planned procedure, and   extremity.     An incision was then made 2 cm distal and lateral to the   anterior superior iliac spine extending over the orientation of the   tensor fascia lata muscle and sharp dissection was carried down to the   fascia of the muscle and protractor placed in the soft tissues.      The fascia  was then incised.  The muscle belly was identified and swept   laterally and retractor placed along the superior neck.  Following   cauterization of the circumflex vessels and removing some pericapsular   fat, a second cobra retractor was placed on the inferior neck.  A third   retractor was placed on the anterior acetabulum after elevating the   anterior rectus.  A L-capsulotomy was along the line of the   superior neck to the trochanteric fossa, then extended proximally and   distally.  Tag sutures were placed and the retractors were then placed   intracapsular.  We then identified the trochanteric fossa and   orientation of my neck cut, confirmed this radiographically   and then made a neck osteotomy with the femur on traction.  The femoral   head was removed without difficulty or complication.  Traction was let   off and retractors were placed posterior and anterior around the   acetabulum.      The labrum and foveal tissue were debrided.  I began reaming with a 78mm   reamer and reamed up to 49mm reamer with good bony bed preparation and a 37mm   cup was chosen.  The final 39mm Pinnacle cup  was then impacted under fluoroscopy  to confirm the depth of penetration and orientation with respect to   abduction.  A screw was placed followed by the hole eliminator.  The final   36+4 neutral Altrex liner was impacted with good visualized rim fit.  The cup was positioned anatomically within the acetabular portion of the pelvis.      At this point, the femur was rolled at 80 degrees.  Further capsule was   released off the inferior aspect of the femoral neck.  I then   released the superior capsule proximally.  The hook was placed laterally   along the femur and elevated manually and held in position with the bed   hook.  The leg was then extended and adducted with the leg rolled to 100   degrees of external rotation.  Once the proximal femur was fully   exposed, I used a box osteotome to set orientation.  I then began   broaching with the starting chili pepper broach and passed this by hand and then broached up to 4.  With the 4 broach in place I chose a high offset neck and did several trial reductions.  The offset was appropriate, leg lengths   appeared to be equal, confirmed radiographically.   Given these findings, I went ahead and dislocated the hip, repositioned all   retractors and positioned the right hip in the extended and abducted position.  The final 4 Hi Tri Lock stem was   chosen and it was impacted down to the level of neck cut.  Based on this   and the trial reduction, a 36+1.5 delta ceramic ball was chosen and   impacted onto a clean and dry trunnion, and the hip was reduced.  The   hip had been irrigated throughout the case again at this point.  I did   reapproximate the superior capsular leaflet to the anterior leaflet   using #1 Vicryl.  The fascia of the   tensor fascia lata muscle was then reapproximated using #1 Vicryl and #0 V-lock sutures.  The   remaining wound was closed with 2-0 Vicryl and running 4-0 Monocryl.   The hip was cleaned, dried, and dressed  sterilely using Dermabond and  Aquacel dressing.  He was then brought   to recovery room in stable condition tolerating the procedure well.    Molli Barrows, PA-C was present for the entirety of the case involved from   preoperative positioning, perioperative retractor management, general   facilitation of the case, as well as primary wound closure as assistant.            Pietro Cassis Alvan Dame, M.D.        09/16/2015 1:12 PM

## 2015-09-16 NOTE — Anesthesia Preprocedure Evaluation (Signed)
Anesthesia Evaluation  Patient identified by MRN, date of birth, ID band Patient awake    Reviewed: Allergy & Precautions, H&P , NPO status , Patient's Chart, lab work & pertinent test results  History of Anesthesia Complications Negative for: history of anesthetic complications  Airway Mallampati: II  TM Distance: >3 FB Neck ROM: full    Dental no notable dental hx.    Pulmonary Current Smoker,    Pulmonary exam normal breath sounds clear to auscultation       Cardiovascular negative cardio ROS Normal cardiovascular exam Rhythm:regular Rate:Normal     Neuro/Psych negative neurological ROS     GI/Hepatic negative GI ROS, Neg liver ROS,   Endo/Other  negative endocrine ROS  Renal/GU negative Renal ROS     Musculoskeletal   Abdominal   Peds  Hematology negative hematology ROS (+)   Anesthesia Other Findings   Reproductive/Obstetrics negative OB ROS                             Anesthesia Physical Anesthesia Plan  ASA: II  Anesthesia Plan: Spinal   Post-op Pain Management:    Induction: Intravenous  Airway Management Planned: Simple Face Mask  Additional Equipment:   Intra-op Plan:   Post-operative Plan:   Informed Consent: I have reviewed the patients History and Physical, chart, labs and discussed the procedure including the risks, benefits and alternatives for the proposed anesthesia with the patient or authorized representative who has indicated his/her understanding and acceptance.   Dental Advisory Given  Plan Discussed with: Anesthesiologist, CRNA and Surgeon  Anesthesia Plan Comments:         Anesthesia Quick Evaluation

## 2015-09-16 NOTE — Anesthesia Procedure Notes (Addendum)
Spinal  Patient location during procedure: OR Start time: 09/16/2015 11:38 AM End time: 09/16/2015 11:41 AM Staffing Resident/CRNA: Anne Fu Performed: resident/CRNA  Preanesthetic Checklist Completed: patient identified, site marked, surgical consent, pre-op evaluation, timeout performed, IV checked, risks and benefits discussed and monitors and equipment checked Spinal Block Patient position: sitting Prep: ChloraPrep Patient monitoring: heart rate, continuous pulse ox and blood pressure Approach: right paramedian Location: L2-3 Injection technique: single-shot Needle Needle type: Whitacre  Needle gauge: 25 G Needle length: 9 cm Additional Notes Expiration date of kit checked and confirmed. Patient tolerated procedure well, without complications.

## 2015-09-16 NOTE — Transfer of Care (Signed)
Immediate Anesthesia Transfer of Care Note  Patient: Barry Roberson  Procedure(s) Performed: Procedure(s): LEFT TOTAL HIP ARTHROPLASTY ANTERIOR APPROACH (Left)  Patient Location: PACU  Anesthesia Type:Spinal  Level of Consciousness: awake, alert  and oriented  Airway & Oxygen Therapy: Patient Spontanous Breathing and Patient connected to face mask oxygen  Post-op Assessment: Report given to RN and Post -op Vital signs reviewed and stable  Post vital signs: Reviewed and stable  Last Vitals:  Vitals:   09/16/15 0826 09/16/15 0855  BP: (!) 161/100 (!) 163/103  Pulse: 64   Resp: 18   Temp: 36.9 C     Last Pain:  Vitals:   09/16/15 1104  TempSrc:   PainSc: 0-No pain      Patients Stated Pain Goal: 4 [id number] [phone])  Complications: No apparent anesthesia complications

## 2015-09-17 ENCOUNTER — Encounter (HOSPITAL_COMMUNITY): Payer: Self-pay | Admitting: Orthopedic Surgery

## 2015-09-17 LAB — CBC
HEMATOCRIT: 37.9 % — AB (ref 39.0–52.0)
HEMOGLOBIN: 13.2 g/dL (ref 13.0–17.0)
MCH: 32.4 pg (ref 26.0–34.0)
MCHC: 34.8 g/dL (ref 30.0–36.0)
MCV: 92.9 fL (ref 78.0–100.0)
Platelets: 179 10*3/uL (ref 150–400)
RBC: 4.08 MIL/uL — ABNORMAL LOW (ref 4.22–5.81)
RDW: 12.9 % (ref 11.5–15.5)
WBC: 15.4 10*3/uL — AB (ref 4.0–10.5)

## 2015-09-17 LAB — BASIC METABOLIC PANEL
ANION GAP: 5 (ref 5–15)
BUN: 19 mg/dL (ref 6–20)
CALCIUM: 8.8 mg/dL — AB (ref 8.9–10.3)
CHLORIDE: 108 mmol/L (ref 101–111)
CO2: 24 mmol/L (ref 22–32)
Creatinine, Ser: 0.98 mg/dL (ref 0.61–1.24)
GFR calc non Af Amer: >60 mL/min (ref >60)
GLUCOSE: 129 mg/dL — AB (ref 65–99)
POTASSIUM: 4.8 mmol/L (ref 3.5–5.1)
Sodium: 137 mmol/L (ref 135–145)

## 2015-09-17 NOTE — Evaluation (Signed)
Occupational Therapy Evaluation Patient Details Name: Barry Roberson MRN: [id number] DOB: 01/09/1961 Today's Date: 09/17/2015    History of Present Illness Pt s/p L THR   Clinical Impression   Patient evaluated by Occupational Therapy with no further acute OT needs identified. All education has been completed and the patient has no further questions. All education completed.  See below for any follow-up Occupational Therapy or equipment needs. OT is signing off. Thank you for this referral.      Follow Up Recommendations  No OT follow up;Supervision - Intermittent    Equipment Recommendations  None recommended by OT    Recommendations for Other Services       Precautions / Restrictions Precautions Precautions: Fall Restrictions Weight Bearing Restrictions: No Other Position/Activity Restrictions: WBAT      Mobility Bed Mobility                  Transfers Overall transfer level: Needs assistance Equipment used: Rolling walker (2 wheeled) Transfers: Stand Pivot Transfers;Sit to/from Stand Sit to Stand: Min assist Stand pivot transfers: Min assist            Balance                                            ADL Overall ADL's : Needs assistance/impaired Eating/Feeding: Independent   Grooming: Wash/dry hands;Wash/dry face;Oral care;Minimal assistance;Standing   Upper Body Bathing: Set up;Sitting   Lower Body Bathing: Minimal assistance;Sit to/from stand   Upper Body Dressing : Set up;Sitting   Lower Body Dressing: Minimal assistance;Sit to/from stand Lower Body Dressing Details (indicate cue type and reason): assist for Lt sock.  instructed him to thread Lt LE through pant leg first  Toilet Transfer: Ambulation;Comfort height toilet;RW;Grab bars;Minimal assistance   Toileting- Clothing Manipulation and Hygiene: Min guard;Sit to/from Nurse, children'sstand     Tub/Shower Transfer Details (indicate cue type and reason): Pt plans to sponge bathe   Functional mobility during ADLs: Min guard;Rolling walker       Vision     Perception     Praxis      Pertinent Vitals/Pain Pain Assessment: 0-10 Pain Score: 3  Pain Location: Lt hip  Pain Descriptors / Indicators: Aching Pain Intervention(s): Monitored during session     Hand Dominance     Extremity/Trunk Assessment Upper Extremity Assessment Upper Extremity Assessment: Overall WFL for tasks assessed   Lower Extremity Assessment Lower Extremity Assessment: Defer to PT evaluation   Cervical / Trunk Assessment Cervical / Trunk Assessment: Normal   Communication Communication Communication: No difficulties   Cognition Arousal/Alertness: Awake/alert Behavior During Therapy: WFL for tasks assessed/performed Overall Cognitive Status: Within Functional Limits for tasks assessed                     General Comments       Exercises       Shoulder Instructions      Home Living Family/patient expects to be discharged to:: Private residence Living Arrangements: Spouse/significant other Available Help at Discharge: Family Type of Home: House Home Access: Stairs to enter Secretary/administratorntrance Stairs-Number of Steps: 1 Entrance Stairs-Rails: None Home Layout: Multi-level Alternate Level Stairs-Number of Steps: 3 (to kitchen and then 10 to bedrooms) Alternate Level Stairs-Rails: None Bathroom Shower/Tub: Tub/shower unit;Curtain   FirefighterBathroom Toilet: Standard     Home Equipment: Environmental consultantWalker - 2 wheels;Cane - single point;Bedside commode  Additional Comments: Pt plans to stay on main level with recliner and half bath      Prior Functioning/Environment Level of Independence: Independent             OT Diagnosis: Generalized weakness;Acute pain   OT Problem List: Pain   OT Treatment/Interventions:      OT Goals(Current goals can be found in the care plan section) Acute Rehab OT Goals Patient Stated Goal: regain independence and eventual return to work  OT Goal  Formulation: All assessment and education complete, DC therapy  OT Frequency:     Barriers to D/C:            Co-evaluation              End of Session Equipment Utilized During Treatment: Surveyor, mining Communication: Mobility status  Activity Tolerance: Patient tolerated treatment well Patient left: in chair;with call bell/phone within reach   Time: [phone] OT Time Calculation (min): 18 min Charges:  OT General Charges $OT Visit: 1 Procedure OT Evaluation $OT Eval Low Complexity: 1 Procedure G-Codes:    Barry Roberson M 10-08-2015, 1:23 PM

## 2015-09-17 NOTE — Progress Notes (Signed)
   Subjective: 1 Day Post-Op Procedure(s) (LRB): LEFT TOTAL HIP ARTHROPLASTY ANTERIOR APPROACH (Left) Patient reports pain as mild.   Patient seen in rounds for Dr. Alvan Dame. Patient is well, and has had no acute complaints or problems. Reports minimal pain. No SOB or chest pain. Ready to go home.    Objective: Vital signs in last 24 hours: Temp:  [97.6 F (36.4 C)-98.8 F (37.1 C)] 98.1 F (36.7 C) (08/23 0544) Pulse Rate:  [46-73] 59 (08/23 0544) Resp:  [9-18] 16 (08/23 0544) BP: (102-163)/(60-103) 123/71 (08/23 0544) SpO2:  [99 %-100 %] 100 % (08/23 0544) Weight:  [103.4 kg (228 lb)] 103.4 kg (228 lb) (08/22 0845)  Intake/Output from previous day:  Intake/Output Summary (Last 24 hours) at 09/17/15 0810 Last data filed at 09/17/15 0634  Gross per 24 hour  Intake           4202.5 ml  Output             2600 ml  Net           1602.5 ml     Labs:  Recent Labs  09/17/15 0429  HGB 13.2    Recent Labs  09/17/15 0429  WBC 15.4*  RBC 4.08*  HCT 37.9*  PLT 179    Recent Labs  09/17/15 0429  NA 137  K 4.8  CL 108  CO2 24  BUN 19  CREATININE 0.98  GLUCOSE 129*  CALCIUM 8.8*    EXAM General - Patient is Alert and Oriented Extremity - Neurologically intact Intact pulses distally Dorsiflexion/Plantar flexion intact Compartment soft Dressing - dressing C/D/I Motor Function - intact, moving foot and toes well on exam.    Past Medical History:  Diagnosis Date  . History of bronchitis    10 years ago     Assessment/Plan: 1 Day Post-Op Procedure(s) (LRB): LEFT TOTAL HIP ARTHROPLASTY ANTERIOR APPROACH (Left) Active Problems:   Status post total replacement of left hip  Estimated body mass index is 33.67 kg/m as calculated from the following:   Height as of this encounter: 5\' 9"  (1.753 m).   Weight as of this encounter: 103.4 kg (228 lb). Advance diet Up with therapy D/C IV fluids Discharge home with home health  DVT Prophylaxis - Aspirin Weight  Bearing As Tolerated    Therapy today and plan for DC home. Discharge instructions given.   Ardeen Jourdain, PA-C Orthopaedic Surgery 09/17/2015, 8:10 AM

## 2015-09-17 NOTE — Progress Notes (Signed)
Physical Therapy Treatment Patient Details Name: Barry Roberson MRN: [id number] DOB: 1960-06-06 Today's Date: 09/17/2015    History of Present Illness Pt s/p L THR    PT Comments    Pt progressing well with mobility and eager for return home.  Reviewed stairs and car transfers with pt and spouse.  Follow Up Recommendations  Home health PT     Equipment Recommendations  None recommended by PT    Recommendations for Other Services OT consult     Precautions / Restrictions Precautions Precautions: Fall Restrictions Weight Bearing Restrictions: No Other Position/Activity Restrictions: WBAT    Mobility  Bed Mobility Overal bed mobility: Needs Assistance Bed Mobility: Supine to Sit;Sit to Supine     Supine to sit: Min guard Sit to supine: Min guard   General bed mobility comments: cues for sequence and use of R LE to self assist  Transfers Overall transfer level: Needs assistance Equipment used: Rolling walker (2 wheeled) Transfers: Sit to/from Stand Sit to Stand: Supervision         General transfer comment: cues for LE management and use of UEs to self assist  Ambulation/Gait Ambulation/Gait assistance: Min guard;Supervision Ambulation Distance (Feet): 200 Feet Assistive device: Rolling walker (2 wheeled) Gait Pattern/deviations: Step-to pattern;Step-through pattern;Decreased step length - right;Decreased step length - left;Shuffle;Trunk flexed Gait velocity: decr   General Gait Details: cues for sequence, posture  and position from RW   Stairs Stairs: Yes Stairs assistance: Min assist Stair Management: No rails;Step to pattern;Backwards;Forwards;With walker Number of Stairs: 3 General stair comments: Single step x 3 - fwd and bkwd - with RW and cues for sequence and foot/RW placement  Wheelchair Mobility    Modified Rankin (Stroke Patients Only)       Balance                                    Cognition Arousal/Alertness:  Awake/alert Behavior During Therapy: WFL for tasks assessed/performed Overall Cognitive Status: Within Functional Limits for tasks assessed                      Exercises      General Comments        Pertinent Vitals/Pain Pain Assessment: 0-10 Pain Score: 3  Pain Location: L hip Pain Descriptors / Indicators: Aching;Sore Pain Intervention(s): Limited activity within patient's tolerance;Monitored during session;Premedicated before session;Ice applied    Home Living                      Prior Function            PT Goals (current goals can now be found in the care plan section) Acute Rehab PT Goals Patient Stated Goal: regain independence and eventual return to work  PT Goal Formulation: With patient Time For Goal Achievement: 09/20/15 Potential to Achieve Goals: Good Progress towards PT goals: Progressing toward goals    Frequency  7X/week    PT Plan Current plan remains appropriate    Co-evaluation             End of Session Equipment Utilized During Treatment: Gait belt Activity Tolerance: Patient tolerated treatment well Patient left: Other (comment) (EOB with RN)     Time: [id number] PT Time Calculation (min) (ACUTE ONLY): 31 min  Charges:  $Gait Training: 8-22 mins $Therapeutic Activity: 8-22 mins  G Codes:      Traniya Prichett 10/14/15, 4:42 PM

## 2015-09-17 NOTE — Care Management Note (Signed)
Case Management Note  Patient Details  Name: GAYLORD SEYDEL MRN: [id number] Date of Birth: Apr 16, 1960  Subjective/Objective:                   Action/Plan:   Expected Discharge Date:                  Expected Discharge Plan:  [place]  In-House Referral:     Discharge planning Services  CM Consult  Post Acute Care Choice:  Home Health Choice offered to:  Patient  DME Arranged:  N/A DME Agency:  NA  HH Arranged:  PT [place] Agency:  [facility] ([facility]  Status of Service:  Completed, signed off  If discussed at [employer], dates discussed:    Additional Comments: CM met with pt in room to offer choice of home health agency.  Pt chooses [facility] to render HHPT.  Referral called to [facility] rep, Tim.  Pt has all DME needed at home.  No other CM needs were communicated. Dellie Catholic, RN 09/17/2015, 12:24 PM

## 2015-09-17 NOTE — Evaluation (Signed)
Physical Therapy Evaluation Patient Details Name: Barry Roberson MRN: [id number] DOB: Mar 29, 1960 Today's Date: 09/17/2015   History of Present Illness  Pt s/p L THR  Clinical Impression  Pt s/p L THR presents with decreased L LE strength/ROM and post op pain limiting functional mobility.  Pt should progress to dc home with family assist and HHPT follow up.    Follow Up Recommendations Home health PT    Equipment Recommendations  None recommended by PT    Recommendations for Other Services OT consult     Precautions / Restrictions Precautions Precautions: Fall Restrictions Weight Bearing Restrictions: No Other Position/Activity Restrictions: WBAT      Mobility  Bed Mobility Overal bed mobility: Needs Assistance Bed Mobility: Supine to Sit     Supine to sit: Min assist     General bed mobility comments: cues for sequence and use of R LE to self assist  Transfers Overall transfer level: Needs assistance Equipment used: Rolling walker (2 wheeled) Transfers: Sit to/from Stand Sit to Stand: Min assist         General transfer comment: cues for LE management and use of UEs to self assist  Ambulation/Gait Ambulation/Gait assistance: Min assist;Min guard Ambulation Distance (Feet): 78 Feet Assistive device: Rolling walker (2 wheeled) Gait Pattern/deviations: Step-to pattern;Shuffle;Trunk flexed Gait velocity: decr Gait velocity interpretation: Below normal speed for age/gender General Gait Details: cues for sequence, posture  and position from [employer]            Wheelchair Mobility    Modified Rankin (Stroke Patients Only)       Balance                                             Pertinent Vitals/Pain Pain Assessment: 0-10 Pain Score: 3  Pain Location: L hip Pain Descriptors / Indicators: Aching;Sore Pain Intervention(s): Limited activity within patient's tolerance;Monitored during session;Premedicated before session;Ice  applied    Home Living Family/patient expects to be discharged to:: Private residence Living Arrangements: Spouse/significant other Available Help at Discharge: Family Type of Home: House Home Access: Stairs to enter Entrance Stairs-Rails: None Entrance Stairs-Number of Steps: 1 Home Layout: Multi-level Home Equipment: Environmental consultant - 2 wheels;Cane - single point Additional Comments: Pt plans to stay on main level with recliner and half bath    Prior Function Level of Independence: Independent               Hand Dominance        Extremity/Trunk Assessment   Upper Extremity Assessment: Overall WFL for tasks assessed           Lower Extremity Assessment: RLE deficits/detail;LLE deficits/detail RLE Deficits / Details: Decreased ROM all planes 2* OA changes LLE Deficits / Details: Strength at hip 2/5 with AAROM at hip to 75 flex and 15 abd  Cervical / Trunk Assessment: Normal  Communication   Communication: No difficulties  Cognition Arousal/Alertness: Awake/alert Behavior During Therapy: WFL for tasks assessed/performed Overall Cognitive Status: Within Functional Limits for tasks assessed                      General Comments      Exercises Total Joint Exercises Ankle Circles/Pumps: AROM;Both;15 reps;Supine Quad Sets: AROM;Both;10 reps;Supine Heel Slides: AAROM;Left;20 reps;Supine Hip ABduction/ADduction: AAROM;Left;15 reps;Supine      Assessment/Plan    PT Assessment Patient needs continued  PT services  PT Diagnosis Difficulty walking   PT Problem List Decreased strength;Decreased range of motion;Decreased activity tolerance;Decreased mobility;Pain;Decreased knowledge of use of DME  PT Treatment Interventions DME instruction;Gait training;Stair training;Functional mobility training;Therapeutic activities;Therapeutic exercise;Patient/family education   PT Goals (Current goals can be found in the Care Plan section) Acute Rehab PT Goals Patient Stated  Goal: 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 PT Goal Formulation: With patient Time For Goal Achievement: 09/20/15 Potential to Achieve Goals: Good    Frequency 7X/week   Barriers to discharge        Co-evaluation               End of Session Equipment Utilized During Treatment: Gait belt Activity Tolerance: Patient tolerated treatment well Patient left: in chair;with call bell/phone within reach;with family/visitor present Nurse Communication: Mobility status         Time: [id number] PT Time Calculation (min) (ACUTE ONLY): 43 min   Charges:   PT Evaluation $PT Eval Low Complexity: 1 Procedure PT Treatments $Gait Training: 8-22 mins $Therapeutic Exercise: 8-22 mins   PT G Codes:        Shenandoah Vandergriff 09-29-2015, 12:18 PM

## 2015-09-18 NOTE — Discharge Summary (Signed)
Physician Discharge Summary   Patient ID: Barry Roberson MRN: [id number] DOB/AGE: 1960-03-16 55 y.o.  Admit date: 09/16/2015 Discharge date: 09/17/2015  Primary Diagnosis: Primary osteoarthritis left hip  Admission Diagnoses:  Past Medical History:  Diagnosis Date  . History of bronchitis    10 years ago    Discharge Diagnoses:   Active Problems:   Status post total replacement of left hip  Estimated body mass index is 33.67 kg/m as calculated from the following:   Height as of this encounter: '5\' 9"'$  (1.753 m).   Weight as of this encounter: 103.4 kg (228 lb).  Procedure(s) (LRB): LEFT TOTAL HIP ARTHROPLASTY ANTERIOR APPROACH (Left)   Consults: None  HPI:  Barry Roberson, 55 y.o. male, has a history of pain and functional disability in the left hip(s) due to arthritis and patient has failed non-surgical conservative treatments for greater than 12 weeks to include NSAID's and/or analgesics, corticosteriod injections and activity modification.  Onset of symptoms was gradual starting 3-4 years ago with gradually worsening course since that time.The patient noted no past surgery on the left hip(s).  Patient currently rates pain in the left hip at 9 out of 10 with activity. Patient has night pain, worsening of pain with activity and weight bearing, trendelenberg gait, pain that interfers with activities of daily living and pain with passive range of motion. Patient has evidence of periarticular osteophytes and joint space narrowing by imaging studies. This condition presents safety issues increasing the risk of falls. There is no current active infection.   Risks, benefits and expectations were discussed with the patient.  Risks including but not limited to the risk of anesthesia, blood clots, nerve damage, blood vessel damage, failure of the prosthesis, infection and up to and including death.  Patient understand the risks, benefits and expectations and wishes to proceed with surgery.    Laboratory Data: Admission on 09/16/2015, Discharged on 09/17/2015  Component Date Value Ref Range Status  . WBC 09/17/2015 15.4* 4.0 - 10.5 K/uL Final  . RBC 09/17/2015 4.08* 4.22 - 5.81 MIL/uL Final  . Hemoglobin 09/17/2015 13.2  13.0 - 17.0 g/dL Final  . HCT 09/17/2015 37.9* 39.0 - 52.0 % Final  . MCV 09/17/2015 92.9  78.0 - 100.0 fL Final  . MCH 09/17/2015 32.4  26.0 - 34.0 pg Final  . MCHC 09/17/2015 34.8  30.0 - 36.0 g/dL Final  . RDW 09/17/2015 12.9  11.5 - 15.5 % Final  . Platelets 09/17/2015 179  150 - 400 K/uL Final  . Sodium 09/17/2015 137  135 - 145 mmol/L Final  . Potassium 09/17/2015 4.8  3.5 - 5.1 mmol/L Final  . Chloride 09/17/2015 108  101 - 111 mmol/L Final  . CO2 09/17/2015 24  22 - 32 mmol/L Final  . Glucose, Bld 09/17/2015 129* 65 - 99 mg/dL Final  . BUN 09/17/2015 19  6 - 20 mg/dL Final  . Creatinine, Ser 09/17/2015 0.98  0.61 - 1.24 mg/dL Final  . Calcium 09/17/2015 8.8* 8.9 - 10.3 mg/dL Final  . GFR calc non Af Amer 09/17/2015 >60  >60 mL/min Final  . GFR calc Af Amer 09/17/2015 >60  >60 mL/min Final   Comment: (NOTE) The eGFR has been calculated using the CKD EPI equation. This calculation has not been validated in all clinical situations. eGFR's persistently <60 mL/min signify possible Chronic Kidney Disease.   Georgiann Hahn gap 09/17/2015 5  5 - 15 Final  Hospital Outpatient Visit on 09/10/2015  Component Date Value  Ref Range Status  . ABO/RH(D) 09/16/2015 O POS   Final  . Antibody Screen 09/16/2015 NEG   Final  . Sample Expiration 09/16/2015 09/19/2015   Final  . Extend sample reason 09/16/2015 NO TRANSFUSIONS OR PREGNANCY IN THE PAST 3 MONTHS   Final  . MRSA, PCR 09/10/2015 NEGATIVE  NEGATIVE Final  . Staphylococcus aureus 09/10/2015 NEGATIVE  NEGATIVE Final   Comment:        The Xpert SA Assay (FDA approved for NASAL specimens in patients over 25 years of age), is one component of a comprehensive surveillance program.  Test performance has been  validated by [facility] for patients greater than or equal to 37 year old. It is not intended to diagnose infection nor to guide or monitor treatment.   . Sodium 09/10/2015 138  135 - 145 mmol/L Final  . Potassium 09/10/2015 4.7  3.5 - 5.1 mmol/L Final  . Chloride 09/10/2015 106  101 - 111 mmol/L Final  . CO2 09/10/2015 26  22 - 32 mmol/L Final  . Glucose, Bld 09/10/2015 99  65 - 99 mg/dL Final  . BUN 09/10/2015 19  6 - 20 mg/dL Final  . Creatinine, Ser 09/10/2015 1.09  0.61 - 1.24 mg/dL Final  . Calcium 09/10/2015 9.2  8.9 - 10.3 mg/dL Final  . GFR calc non Af Amer 09/10/2015 >60  >60 mL/min Final  . GFR calc Af Amer 09/10/2015 >60  >60 mL/min Final   Comment: (NOTE) The eGFR has been calculated using the CKD EPI equation. This calculation has not been validated in all clinical situations. eGFR's persistently <60 mL/min signify possible Chronic Kidney Disease.   . Anion gap 09/10/2015 6  5 - 15 Final  . WBC 09/10/2015 6.1  4.0 - 10.5 K/uL Final  . RBC 09/10/2015 4.41  4.22 - 5.81 MIL/uL Final  . Hemoglobin 09/10/2015 14.2  13.0 - 17.0 g/dL Final  . HCT 09/10/2015 41.6  39.0 - 52.0 % Final  . MCV 09/10/2015 94.3  78.0 - 100.0 fL Final  . MCH 09/10/2015 32.2  26.0 - 34.0 pg Final  . MCHC 09/10/2015 34.1  30.0 - 36.0 g/dL Final  . RDW 09/10/2015 13.0  11.5 - 15.5 % Final  . Platelets 09/10/2015 193  150 - 400 K/uL Final  . Prothrombin Time 09/10/2015 12.5  11.4 - 15.2 seconds Final  . INR 09/10/2015 0.93   Final  . ABO/RH(D) 09/10/2015 O POS   Final     X-Rays:Dg C-arm 1-60 Min-no Report  Result Date: 09/16/2015 CLINICAL DATA:  Status post a total left hip replacement. EXAM: DG C-ARM 1-60 MIN-NO REPORT; DG HIP (WITH OR WITHOUT PELVIS) 1V PORT LEFT COMPARISON:  None. FINDINGS: There has been a recent left total left hip arthroplasty with screwed in acetabular component and short stem femoral component. The alignment is near anatomic. There is no evidence of immediate  complications. No evidence of acute fracture. Osteoarthritic changes of the right hip are noted. Urinary catheter is in place. IMPRESSION: Post total left hip arthroplasty without evidence of immediate complications. Electronically Signed   By: Fidela Salisbury M.D.   On: 09/16/2015 14:12   Dg Hip Port Unilat With Pelvis 1v Left  Result Date: 09/16/2015 CLINICAL DATA:  Status post a total left hip replacement. EXAM: DG C-ARM 1-60 MIN-NO REPORT; DG HIP (WITH OR WITHOUT PELVIS) 1V PORT LEFT COMPARISON:  None. FINDINGS: There has been a recent left total left hip arthroplasty with screwed in acetabular component and short  stem femoral component. The alignment is near anatomic. There is no evidence of immediate complications. No evidence of acute fracture. Osteoarthritic changes of the right hip are noted. Urinary catheter is in place. IMPRESSION: Post total left hip arthroplasty without evidence of immediate complications. Electronically Signed   By: Fidela Salisbury M.D.   On: 09/16/2015 14:12    EKG:No orders found for this or any previous visit.   Hospital Course: Patient was admitted to [facility] and taken to the OR and underwent the above state procedure without complications.  Patient tolerated the procedure well and was later transferred to the recovery room and then to the orthopaedic floor for postoperative care.  They were given PO and IV analgesics for pain control following their surgery.  They were given 24 hours of postoperative antibiotics of  Anti-infectives    Start     Dose/Rate Route Frequency Ordered Stop   09/16/15 1800  ceFAZolin (ANCEF) IVPB 1 g/50 mL premix     1 g 100 mL/hr over 30 Minutes Intravenous Every 6 hours 09/16/15 1509 09/17/15 0040   09/16/15 0823  ceFAZolin (ANCEF) IVPB 2g/100 mL premix     2 g 200 mL/hr over 30 Minutes Intravenous On call to O.R. 09/16/15 1017 09/16/15 1143     and started on DVT prophylaxis in the form of Aspirin.   PT and OT  were ordered for total hip protocol.  The patient was allowed to be WBAT with therapy. Discharge planning was consulted to help with postop disposition and equipment needs.  Patient had a good night on the evening of surgery.  They started to get up OOB with therapy on day one.  The patient had progressed with therapy and meeting their goals.  Incision was healing well.  Patient was seen in rounds and was ready to go home.   Diet: Cardiac diet Activity:WBAT Follow-up:in 2 weeks Disposition - Home Discharged Condition: stable   Discharge Instructions    Call MD / Call 911    Complete by:  As directed   If you experience chest pain or shortness of breath, CALL 911 and be transported to the hospital emergency room.  If you develope a fever above 101 F, pus (white drainage) or increased drainage or redness at the wound, or calf pain, call your surgeon's office.   Constipation Prevention    Complete by:  As directed   Drink plenty of fluids.  Prune juice may be helpful.  You may use a stool softener, such as Colace (over the counter) 100 mg twice a day.  Use MiraLax (over the counter) for constipation as needed.   Diet - low sodium heart healthy    Complete by:  As directed   Discharge instructions    Complete by:  As directed   INSTRUCTIONS AFTER JOINT REPLACEMENT   Remove items at home which could result in a fall. This includes throw rugs or furniture in walking pathways ICE to the affected joint every three hours while awake for 30 minutes at a time, for at least the first 3-5 days, and then as needed for pain and swelling.  Continue to use ice for pain and swelling. You may notice swelling that will progress down to the foot and ankle.  This is normal after surgery.  Elevate your leg when you are not up walking on it.   Continue to use the breathing machine you got in the hospital (incentive spirometer) which will help keep your temperature down.  It is common for your temperature to cycle up  and down following surgery, especially at night when you are not up moving around and exerting yourself.  The breathing machine keeps your lungs expanded and your temperature down.   DIET:  As you were doing prior to hospitalization, we recommend a well-balanced diet.  DRESSING / WOUND CARE / SHOWERING  Keep the surgical dressing until follow up.  The dressing is water proof, so you can shower without any extra covering.  IF THE DRESSING FALLS OFF or the wound gets wet inside, change the dressing with sterile gauze.  Please use good hand washing techniques before changing the dressing.  Do not use any lotions or creams on the incision until instructed by your surgeon.    ACTIVITY  Increase activity slowly as tolerated, but follow the weight bearing instructions below.   No driving for 6 weeks or until further direction given by your physician.  You cannot drive while taking narcotics.  No lifting or carrying greater than 10 lbs. until further directed by your surgeon. Avoid periods of inactivity such as sitting longer than an hour when not asleep. This helps prevent blood clots.  You may return to work once you are authorized by your doctor.     WEIGHT BEARING   Weight bearing as tolerated with assist device (walker, cane, etc) as directed, use it as long as suggested by your surgeon or therapist, typically at least 4-6 weeks.   EXERCISES  Results after joint replacement surgery are often greatly improved when you follow the exercise, range of motion and muscle strengthening exercises prescribed by your doctor. Safety measures are also important to protect the joint from further injury. Any time any of these exercises cause you to have increased pain or swelling, decrease what you are doing until you are comfortable again and then slowly increase them. If you have problems or questions, call your caregiver or physical therapist for advice.   Rehabilitation is important following a joint  replacement. After just a few days of immobilization, the muscles of the leg can become weakened and shrink (atrophy).  These exercises are designed to build up the tone and strength of the thigh and leg muscles and to improve motion. Often times heat used for twenty to thirty minutes before working out will loosen up your tissues and help with improving the range of motion but do not use heat for the first two weeks following surgery (sometimes heat can increase post-operative swelling).   These exercises can be done on a training (exercise) mat, on the floor, on a table or on a bed. Use whatever works the best and is most comfortable for you.    Use music or television while you are exercising so that the exercises are a pleasant break in your day. This will make your life better with the exercises acting as a break in your routine that you can look forward to.   Perform all exercises about fifteen times, three times per day or as directed.  You should exercise both the operative leg and the other leg as well.  Exercises include:   Quad Sets - Tighten up the muscle on the front of the thigh (Quad) and hold for 5-10 seconds.   Straight Leg Raises - With your knee straight (if you were given a brace, keep it on), lift the leg to 60 degrees, hold for 3 seconds, and slowly lower the leg.  Perform this exercise against resistance later as  your leg gets stronger.  Leg Slides: Lying on your back, slowly slide your foot toward your buttocks, bending your knee up off the floor (only go as far as is comfortable). Then slowly slide your foot back down until your leg is flat on the floor again.  Angel Wings: Lying on your back spread your legs to the side as far apart as you can without causing discomfort.  Hamstring Strength:  Lying on your back, push your heel against the floor with your leg straight by tightening up the muscles of your buttocks.  Repeat, but this time bend your knee to a comfortable angle, and  push your heel against the floor.  You may put a pillow under the heel to make it more comfortable if necessary.   A rehabilitation program following joint replacement surgery can speed recovery and prevent re-injury in the future due to weakened muscles. Contact your doctor or a physical therapist for more information on knee rehabilitation.    CONSTIPATION  Constipation is defined medically as fewer than three stools per week and severe constipation as less than one stool per week.  Even if you have a regular bowel pattern at home, your normal regimen is likely to be disrupted due to multiple reasons following surgery.  Combination of anesthesia, postoperative narcotics, change in appetite and fluid intake all can affect your bowels.   YOU MUST use at least one of the following options; they are listed in order of increasing strength to get the job done.  They are all available over the counter, and you may need to use some, POSSIBLY even all of these options:    Drink plenty of fluids (prune juice may be helpful) and high fiber foods Colace 100 mg by mouth twice a day  Senokot for constipation as directed and as needed Dulcolax (bisacodyl), take with full glass of water  Miralax (polyethylene glycol) once or twice a day as needed.  If you have tried all these things and are unable to have a bowel movement in the first 3-4 days after surgery call either your surgeon or your primary doctor.    If you experience loose stools or diarrhea, hold the medications until you stool forms back up.  If your symptoms do not get better within 1 week or if they get worse, check with your doctor.  If you experience "the worst abdominal pain ever" or develop nausea or vomiting, please contact the office immediately for further recommendations for treatment.   ITCHING:  If you experience itching with your medications, try taking only a single pain pill, or even half a pain pill at a time.  You can also use  Benadryl over the counter for itching or also to help with sleep.   TED HOSE STOCKINGS:  Use stockings on both legs until for at least 2 weeks or as directed by physician office. They may be removed at night for sleeping.  MEDICATIONS:  See your medication summary on the "After Visit Summary" that nursing will review with you.  You may have some home medications which will be placed on hold until you complete the course of blood thinner medication.  It is important for you to complete the blood thinner medication as prescribed.  PRECAUTIONS:  If you experience chest pain or shortness of breath - call 911 immediately for transfer to the hospital emergency department.   If you develop a fever greater that 101 F, purulent drainage from wound, increased redness or drainage from  wound, foul odor from the wound/dressing, or calf pain - CONTACT YOUR SURGEON.                                                   FOLLOW-UP APPOINTMENTS:  If you do not already have a post-op appointment, please call the office for an appointment to be seen by your surgeon.  Guidelines for how soon to be seen are listed in your "After Visit Summary", but are typically between 1-4 weeks after surgery.   MAKE SURE YOU:  Understand these instructions.  Get help right away if you are not doing well or get worse.    Thank you for letting us be a part of your medical care team.  It is a privilege we respect greatly.  We hope these instructions will help you stay on track for a fast and full recovery!   Increase activity slowly as tolerated    Complete by:  As directed       Medication List    STOP taking these medications   acetaminophen 325 MG tablet Commonly known as:  TYLENOL   ibuprofen 200 MG tablet Commonly known as:  ADVIL,MOTRIN     TAKE these medications   aspirin 81 MG chewable tablet Chew 1 tablet (81 mg total) by mouth 2 (two) times daily.   HYDROcodone-acetaminophen 7.5-325 MG tablet Commonly known as:   NORCO Take 1-2 tablets by mouth every 4 (four) hours as needed (breakthrough pain).   methocarbamol 500 MG tablet Commonly known as:  ROBAXIN Take 1 tablet (500 mg total) by mouth every 6 (six) hours as needed for muscle spasms.      Follow-up Information    [name], MD. Schedule an appointment as soon as possible for a visit in 2 week(s).   Specialty:  Orthopedic Surgery Contact information: [address] [place] [address] [phone]        [facility] .   Why:  now known as [facility]; will provide home health physical therapy Contact information: [place] [address] [phone]           Signed: Ardeen Jourdain, PA-C Orthopaedic Surgery 09/18/2015, 11:15 AM

## 2015-10-11 NOTE — H&P (Signed)
TOTAL HIP ADMISSION H&P  Patient is admitted for right total hip arthroplasty, anterior approach.  Subjective:  Chief Complaint:    Right hip primary OA / pain  HPI: Barry Roberson, 55 y.o. male, has a history of pain and functional disability in the right hip(s) due to arthritis and patient has failed non-surgical conservative treatments for greater than 12 weeks to include NSAID's and/or analgesics, corticosteriod injections and activity modification.  Onset of symptoms was gradual starting 3-4 years ago with gradually worsening course since that time.The patient noted prior procedures of the hip to include arthroplasty on the left hip(s).  Patient currently rates pain in the right hip at 8 out of 10 with activity. Patient has night pain, worsening of pain with activity and weight bearing, trendelenberg gait, pain that interfers with activities of daily living and pain with passive range of motion. Patient has evidence of periarticular osteophytes and joint space narrowing by imaging studies. This condition presents safety issues increasing the risk of falls.  There is no current active infection.   Risks, benefits and expectations were discussed with the patient.  Risks including but not limited to the risk of anesthesia, blood clots, nerve damage, blood vessel damage, failure of the prosthesis, infection and up to and including death.  Patient understand the risks, benefits and expectations and wishes to proceed with surgery.   PCP:    No PCP Per Patient  D/C Plans:      Home  Post-op Meds:       No Rx given  Tranexamic Acid:      To be given - IV   Decadron:      Is to be given  FYI:     ASA             [place]   Patient Active Problem List   Diagnosis Date Noted  . Status post total replacement of left hip 09/16/2015   Past Medical History:  Diagnosis Date  . History of bronchitis    10 years ago     Past Surgical History:  Procedure Laterality Date  . BACK SURGERY     08/2008 secondary to ruptured L3   . TONSILLECTOMY    . TOTAL HIP ARTHROPLASTY Left 09/16/2015   Procedure: LEFT TOTAL HIP ARTHROPLASTY ANTERIOR APPROACH;  Surgeon: Paralee Cancel, MD;  Location: [facility];  Service: Orthopedics;  Laterality: Left;    No prescriptions prior to admission.   No Known Allergies   Social History  Substance Use Topics  . Smoking status: Current Every Day Smoker    Packs/day: 0.50    Years: 30.00    Types: Cigarettes  . Smokeless tobacco: Never Used  . Alcohol use No       Review of Systems  Constitutional: Negative.   HENT: Negative.   Eyes: Negative.   Respiratory: Negative.   Cardiovascular: Negative.   Gastrointestinal: Negative.   Genitourinary: Negative.   Musculoskeletal: Positive for joint pain.  Skin: Negative.   Neurological: Negative.   Endo/Heme/Allergies: Negative.   Psychiatric/Behavioral: Negative.     Objective:  Physical Exam  Constitutional: He is oriented to person, place, and time. He appears well-developed.  HENT:  Head: Normocephalic.  Eyes: Pupils are equal, round, and reactive to light.  Neck: Neck supple. No JVD present. No tracheal deviation present. No thyromegaly present.  Cardiovascular: Normal rate, regular rhythm, normal heart sounds and intact distal pulses.   Respiratory: Effort normal and breath sounds normal. No respiratory distress. He  has no wheezes.  GI: Soft. There is no tenderness. There is no guarding.  Musculoskeletal:       Right hip: He exhibits decreased range of motion, decreased strength, tenderness and bony tenderness. He exhibits no swelling, no deformity and no laceration.  Lymphadenopathy:    He has no cervical adenopathy.  Neurological: He is alert and oriented to person, place, and time.  Skin: Skin is warm and dry.  Psychiatric: He has a normal mood and affect.      Labs:  Estimated body mass index is 33.67 kg/m as calculated from the following:   Height as of 09/16/15: 5\' 9"  (1.753  m).   Weight as of 09/16/15: 103.4 kg (228 lb).   Imaging Review Plain radiographs demonstrate severe degenerative joint disease of the right hip(s). The bone quality appears to be good for age and reported activity level.  Assessment/Plan:  End stage arthritis, right hip(s)  The patient history, physical examination, clinical judgement of the provider and imaging studies are consistent with end stage degenerative joint disease of the right hip(s) and total hip arthroplasty is deemed medically necessary. The treatment options including medical management, injection therapy, arthroscopy and arthroplasty were discussed at length. The risks and benefits of total hip arthroplasty were presented and reviewed. The risks due to aseptic loosening, infection, stiffness, dislocation/subluxation,  thromboembolic complications and other imponderables were discussed.  The patient acknowledged the explanation, agreed to proceed with the plan and consent was signed. Patient is being admitted for inpatient treatment for surgery, pain control, PT, OT, prophylactic antibiotics, VTE prophylaxis, progressive ambulation and ADL's and discharge planning.The patient is planning to be discharged home.      [place] Reniyah Gootee   PA-C  10/11/2015, 2:16 PM

## 2015-10-17 ENCOUNTER — Other Ambulatory Visit (HOSPITAL_COMMUNITY): Payer: Self-pay | Admitting: *Deleted

## 2015-10-17 NOTE — Patient Instructions (Addendum)
FRAK PAOLINI  10/17/2015   Your procedure is scheduled on: 10-28-15  Report to [facility] Main  Entrance take [facility]  elevators to 3rd floor to  [place] at 700  AM.  Call this number if you have problems the morning of surgery [phone]   Remember: ONLY 1 PERSON MAY GO WITH YOU TO SHORT STAY TO GET  READY MORNING OF [place].  Do not eat food or drink liquids :After Midnight.     Take these medicines the morning of surgery with A SIP OF WATER: none                               You may not have any metal on your body including hair pins and              piercings  Do not wear jewelry, make-up, lotions, powders or perfumes, deodorant             Do not wear nail polish.  Do not shave  48 hours prior to surgery.              Men may shave face and neck.   Do not bring valuables to the hospital. [employer].  Contacts, dentures or bridgework may not be worn into surgery.  Leave suitcase in the car. After surgery it may be brought to your room.                 Please read over the following fact sheets you were given: _____________________________________________________________________             [facility] - Preparing for Surgery Before surgery, you can play an important role.  Because skin is not sterile, your skin needs to be as free of germs as possible.  You can reduce the number of germs on your skin by washing with CHG (chlorahexidine gluconate) soap before surgery.  CHG is an antiseptic cleaner which kills germs and bonds with the skin to continue killing germs even after washing. Please DO NOT use if you have an allergy to CHG or antibacterial soaps.  If your skin becomes reddened/irritated stop using the CHG and inform your nurse when you arrive at Short Stay. Do not shave (including legs and underarms) for at least 48 hours prior to the first CHG shower.  You may shave your  face/neck. Please follow these instructions carefully:  1.  Shower with CHG Soap the night before surgery and the  morning of Surgery.  2.  If you choose to wash your hair, wash your hair first as usual with your  normal  shampoo.  3.  After you shampoo, rinse your hair and body thoroughly to remove the  shampoo.                           4.  Use CHG as you would any other liquid soap.  You can apply chg directly  to the skin and wash                       Gently with a scrungie or clean washcloth.  5.  Apply the CHG Soap to  your body ONLY FROM THE NECK DOWN.   Do not use on face/ open                           Wound or open sores. Avoid contact with eyes, ears mouth and genitals (private parts).                       Wash face,  Genitals (private parts) with your normal soap.             6.  Wash thoroughly, paying special attention to the area where your surgery  will be performed.  7.  Thoroughly rinse your body with warm water from the neck down.  8.  DO NOT shower/wash with your normal soap after using and rinsing off  the CHG Soap.                9.  Pat yourself dry with a clean towel.            10.  Wear clean pajamas.            11.  Place clean sheets on your bed the night of your first shower and do not  sleep with pets. Day of Surgery : Do not apply any lotions/deodorants the morning of surgery.  Please wear clean clothes to the hospital/surgery center.  FAILURE TO FOLLOW THESE INSTRUCTIONS MAY RESULT IN THE CANCELLATION OF YOUR SURGERY PATIENT SIGNATURE_________________________________  NURSE SIGNATURE__________________________________  ________________________________________________________________________   Adam Phenix  An incentive spirometer is a tool that can help keep your lungs clear and active. This tool measures how well you are filling your lungs with each breath. Taking long deep breaths may help reverse or decrease the chance of developing breathing  (pulmonary) problems (especially infection) following:  A long period of time when you are unable to move or be active. BEFORE THE PROCEDURE   If the spirometer includes an indicator to show your best effort, your nurse or respiratory therapist will set it to a desired goal.  If possible, sit up straight or lean slightly forward. Try not to slouch.  Hold the incentive spirometer in an upright position. INSTRUCTIONS FOR USE  1. Sit on the edge of your bed if possible, or sit up as far as you can in bed or on a chair. 2. Hold the incentive spirometer in an upright position. 3. Breathe out normally. 4. Place the mouthpiece in your mouth and seal your lips tightly around it. 5. Breathe in slowly and as deeply as possible, raising the piston or the ball toward the top of the column. 6. Hold your breath for 3-5 seconds or for as long as possible. Allow the piston or ball to fall to the bottom of the column. 7. Remove the mouthpiece from your mouth and breathe out normally. 8. Rest for a few seconds and repeat Steps 1 through 7 at least 10 times every 1-2 hours when you are awake. Take your time and take a few normal breaths between deep breaths. 9. The spirometer may include an indicator to show your best effort. Use the indicator as a goal to work toward during each repetition. 10. After each set of 10 deep breaths, practice coughing to be sure your lungs are clear. If you have an incision (the cut made at the time of surgery), support your incision when coughing by placing a pillow or rolled up towels firmly against  it. Once you are able to get out of bed, walk around indoors and cough well. You may stop using the incentive spirometer when instructed by your caregiver.  RISKS AND COMPLICATIONS  Take your time so you do not get dizzy or light-headed.  If you are in pain, you may need to take or ask for pain medication before doing incentive spirometry. It is harder to take a deep breath if you  are having pain. AFTER USE  Rest and breathe slowly and easily.  It can be helpful to keep track of a log of your progress. Your caregiver can provide you with a simple table to help with this. If you are using the spirometer at home, follow these instructions: [place] IF:   You are having difficultly using the spirometer.  You have trouble using the spirometer as often as instructed.  Your pain medication is not giving enough relief while using the spirometer.  You develop fever of 100.5 F (38.1 C) or higher. SEEK IMMEDIATE MEDICAL CARE IF:   You cough up bloody sputum that had not been present before.  You develop fever of 102 F (38.9 C) or greater.  You develop worsening pain at or near the incision site. MAKE SURE YOU:   Understand these instructions.  Will watch your condition.  Will get help right away if you are not doing well or get worse. Document Released: 05/24/2006 Document Revised: 04/05/2011 Document Reviewed: 07/25/2006 ExitCare Patient Information 2014 ExitCare, [place].   ________________________________________________________________________  WHAT IS A BLOOD TRANSFUSION? Blood Transfusion Information  A transfusion is the replacement of blood or some of its parts. Blood is made up of multiple cells which provide different functions.  Red blood cells carry oxygen and are used for blood loss replacement.  White blood cells fight against infection.  Platelets control bleeding.  Plasma helps clot blood.  Other blood products are available for specialized needs, such as hemophilia or other clotting disorders. BEFORE THE TRANSFUSION  Who gives blood for transfusions?   Healthy volunteers who are fully evaluated to make sure their blood is safe. This is blood bank blood. Transfusion therapy is the safest it has ever been in the practice of medicine. Before blood is taken from a donor, a complete history is taken to make sure that person has  no history of diseases nor engages in risky social behavior (examples are intravenous drug use or sexual activity with multiple partners). The donor's travel history is screened to minimize risk of transmitting infections, such as malaria. The donated blood is tested for signs of infectious diseases, such as HIV and hepatitis. The blood is then tested to be sure it is compatible with you in order to minimize the chance of a transfusion reaction. If you or a relative donates blood, this is often done in anticipation of surgery and is not appropriate for emergency situations. It takes many days to process the donated blood. RISKS AND COMPLICATIONS Although transfusion therapy is very safe and saves many lives, the main dangers of transfusion include:   Getting an infectious disease.  Developing a transfusion reaction. This is an allergic reaction to something in the blood you were given. Every precaution is taken to prevent this. The decision to have a blood transfusion has been considered carefully by your caregiver before blood is given. Blood is not given unless the benefits outweigh the risks. AFTER THE TRANSFUSION  Right after receiving a blood transfusion, you will usually feel much better and more  energetic. This is especially true if your red blood cells have gotten low (anemic). The transfusion raises the level of the red blood cells which carry oxygen, and this usually causes an energy increase.  The nurse administering the transfusion will monitor you carefully for complications. HOME CARE INSTRUCTIONS  No special instructions are needed after a transfusion. You may find your energy is better. Speak with your caregiver about any limitations on activity for underlying diseases you may have. SEEK MEDICAL CARE IF:   Your condition is not improving after your transfusion.  You develop redness or irritation at the intravenous (IV) site. SEEK IMMEDIATE MEDICAL CARE IF:  Any of the following  symptoms occur over the next 12 hours:  Shaking chills.  You have a temperature by mouth above 102 F (38.9 C), not controlled by medicine.  Chest, back, or muscle pain.  People around you feel you are not acting correctly or are confused.  Shortness of breath or difficulty breathing.  Dizziness and fainting.  You get a rash or develop hives.  You have a decrease in urine output.  Your urine turns a dark color or changes to pink, red, or brown. Any of the following symptoms occur over the next 10 days:  You have a temperature by mouth above 102 F (38.9 C), not controlled by medicine.  Shortness of breath.  Weakness after normal activity.  The white part of the eye turns yellow (jaundice).  You have a decrease in the amount of urine or are urinating less often.  Your urine turns a dark color or changes to pink, red, or brown. Document Released: 01/09/2000 Document Revised: 04/05/2011 Document Reviewed: 08/28/2007 [facility] Patient Information 2014 [place], [place].  _______________________________________________________________________

## 2015-10-17 NOTE — Progress Notes (Signed)
MEDICAL CLEARANCE NOTE TAMRA CHEEK PAC ON CHART  EKG 09-11-16 FAST MED ON CHART CHEST XRAY 2 VIEW 09-12-15 FAST MED ON CHART

## 2015-10-21 ENCOUNTER — Encounter (HOSPITAL_COMMUNITY): Payer: Self-pay

## 2015-10-21 ENCOUNTER — Encounter (HOSPITAL_COMMUNITY)
Admission: RE | Admit: 2015-10-21 | Discharge: 2015-10-21 | Disposition: A | Discharge status: Home / Self Care | Payer: Commercial Managed Care - HMO | Source: Ambulatory Visit | Attending: Orthopedic Surgery | Admitting: Orthopedic Surgery

## 2015-10-21 DIAGNOSIS — Z01818 Encounter for other preprocedural examination: Secondary | ICD-10-CM | POA: Diagnosis not present

## 2015-10-21 HISTORY — DX: Unspecified osteoarthritis, unspecified site: M19.90

## 2015-10-21 LAB — CBC
HEMATOCRIT: 38.8 % — AB (ref 39.0–52.0)
HEMOGLOBIN: 13.4 g/dL (ref 13.0–17.0)
MCH: 31.9 pg (ref 26.0–34.0)
MCHC: 34.5 g/dL (ref 30.0–36.0)
MCV: 92.4 fL (ref 78.0–100.0)
Platelets: 219 10*3/uL (ref 150–400)
RBC: 4.2 MIL/uL — ABNORMAL LOW (ref 4.22–5.81)
RDW: 12.9 % (ref 11.5–15.5)
WBC: 7.1 10*3/uL (ref 4.0–10.5)

## 2015-10-21 LAB — SURGICAL PCR SCREEN
MRSA, PCR: NEGATIVE
Staphylococcus aureus: NEGATIVE

## 2015-10-28 ENCOUNTER — Inpatient Hospital Stay (HOSPITAL_COMMUNITY): Payer: Commercial Managed Care - HMO

## 2015-10-28 ENCOUNTER — Inpatient Hospital Stay (HOSPITAL_COMMUNITY)
Admission: RE | Admit: 2015-10-28 | Discharge: 2015-10-29 | DRG: 470 | Disposition: A | Discharge status: Home Health | Payer: Commercial Managed Care - HMO | Source: Ambulatory Visit | Attending: Orthopedic Surgery | Admitting: Orthopedic Surgery

## 2015-10-28 ENCOUNTER — Encounter (HOSPITAL_COMMUNITY)
Admission: RE | Disposition: A | Discharge status: Home Health | Payer: Self-pay | Source: Ambulatory Visit | Attending: Orthopedic Surgery

## 2015-10-28 ENCOUNTER — Encounter (HOSPITAL_COMMUNITY): Payer: Self-pay

## 2015-10-28 ENCOUNTER — Inpatient Hospital Stay (HOSPITAL_COMMUNITY): Payer: Commercial Managed Care - HMO | Admitting: Certified Registered"

## 2015-10-28 DIAGNOSIS — E669 Obesity, unspecified: Secondary | ICD-10-CM | POA: Diagnosis present

## 2015-10-28 DIAGNOSIS — Z96642 Presence of left artificial hip joint: Secondary | ICD-10-CM | POA: Diagnosis present

## 2015-10-28 DIAGNOSIS — R262 Difficulty in walking, not elsewhere classified: Secondary | ICD-10-CM

## 2015-10-28 DIAGNOSIS — M1611 Unilateral primary osteoarthritis, right hip: Principal | ICD-10-CM | POA: Diagnosis present

## 2015-10-28 DIAGNOSIS — Z6832 Body mass index (BMI) 32.0-32.9, adult: Secondary | ICD-10-CM

## 2015-10-28 DIAGNOSIS — M25751 Osteophyte, right hip: Secondary | ICD-10-CM | POA: Diagnosis present

## 2015-10-28 DIAGNOSIS — F17210 Nicotine dependence, cigarettes, uncomplicated: Secondary | ICD-10-CM | POA: Diagnosis present

## 2015-10-28 DIAGNOSIS — Z96649 Presence of unspecified artificial hip joint: Secondary | ICD-10-CM

## 2015-10-28 HISTORY — PX: TOTAL HIP ARTHROPLASTY: SHX124

## 2015-10-28 LAB — TYPE AND SCREEN
ABO/RH(D): O POS
Antibody Screen: NEGATIVE

## 2015-10-28 SURGERY — ARTHROPLASTY, HIP, TOTAL, ANTERIOR APPROACH
Anesthesia: Spinal | Site: Hip | Laterality: Right

## 2015-10-28 MED ORDER — LACTATED RINGERS IV SOLN
INTRAVENOUS | Status: DC
  Administered 2015-10-28 (×3): via INTRAVENOUS

## 2015-10-28 MED ORDER — ALUM & MAG HYDROXIDE-SIMETH 200-200-20 MG/5ML PO SUSP: 30.0000 mL | ORAL | Status: DC | PRN

## 2015-10-28 MED ORDER — HYDROMORPHONE HCL 1 MG/ML IJ SOLN
INTRAMUSCULAR | Status: AC
  Filled 2015-10-28: qty 1

## 2015-10-28 MED ORDER — HYDROCODONE-ACETAMINOPHEN 7.5-325 MG PO TABS
1.0000 | ORAL_TABLET | ORAL | Status: DC
  Administered 2015-10-28 – 2015-10-29 (×5): 2 via ORAL
  Filled 2015-10-28 (×5): qty 2

## 2015-10-28 MED ORDER — PROPOFOL 10 MG/ML IV BOLUS
INTRAVENOUS | Status: AC
  Filled 2015-10-28: qty 20

## 2015-10-28 MED ORDER — PROPOFOL 10 MG/ML IV BOLUS
INTRAVENOUS | Status: AC
  Filled 2015-10-28: qty 40

## 2015-10-28 MED ORDER — SODIUM CHLORIDE 0.9 % IR SOLN
Status: DC | PRN
  Administered 2015-10-28: 1000 mL

## 2015-10-28 MED ORDER — METOCLOPRAMIDE HCL 5 MG/ML IJ SOLN: 5.0000 mg | Freq: Three times a day (TID) | INTRAMUSCULAR | Status: DC | PRN

## 2015-10-28 MED ORDER — MAGNESIUM CITRATE PO SOLN: 1.0000 | Freq: Once | ORAL | Status: DC | PRN

## 2015-10-28 MED ORDER — METOCLOPRAMIDE HCL 5 MG PO TABS: 5.0000 mg | ORAL_TABLET | Freq: Three times a day (TID) | ORAL | Status: DC | PRN

## 2015-10-28 MED ORDER — PROPOFOL 10 MG/ML IV BOLUS
INTRAVENOUS | Status: DC | PRN
  Administered 2015-10-28: 20 mg via INTRAVENOUS

## 2015-10-28 MED ORDER — TRANEXAMIC ACID 1000 MG/10ML IV SOLN
1000.0000 mg | INTRAVENOUS | Status: AC
  Administered 2015-10-28: 1000 mg via INTRAVENOUS
  Filled 2015-10-28: qty 1100

## 2015-10-28 MED ORDER — CEFAZOLIN SODIUM-DEXTROSE 2-4 GM/100ML-% IV SOLN
2.0000 g | INTRAVENOUS | Status: AC
  Administered 2015-10-28: 2 g via INTRAVENOUS
  Filled 2015-10-28: qty 100

## 2015-10-28 MED ORDER — HYDROMORPHONE HCL 1 MG/ML IJ SOLN: 0.2500 mg | INTRAMUSCULAR | Status: DC | PRN

## 2015-10-28 MED ORDER — METHOCARBAMOL 500 MG PO TABS
500.0000 mg | ORAL_TABLET | Freq: Four times a day (QID) | ORAL | Status: DC | PRN
  Administered 2015-10-28 – 2015-10-29 (×2): 500 mg via ORAL
  Filled 2015-10-28 (×2): qty 1

## 2015-10-28 MED ORDER — ONDANSETRON HCL 4 MG/2ML IJ SOLN
INTRAMUSCULAR | Status: AC
  Filled 2015-10-28: qty 2

## 2015-10-28 MED ORDER — MIDAZOLAM HCL 2 MG/2ML IJ SOLN
INTRAMUSCULAR | Status: AC
  Filled 2015-10-28: qty 2

## 2015-10-28 MED ORDER — DOCUSATE SODIUM 100 MG PO CAPS
100.0000 mg | ORAL_CAPSULE | Freq: Two times a day (BID) | ORAL | Status: DC
  Administered 2015-10-28 – 2015-10-29 (×2): 100 mg via ORAL
  Filled 2015-10-28 (×2): qty 1

## 2015-10-28 MED ORDER — FENTANYL CITRATE (PF) 100 MCG/2ML IJ SOLN
INTRAMUSCULAR | Status: DC | PRN
  Administered 2015-10-28 (×2): 50 ug via INTRAVENOUS

## 2015-10-28 MED ORDER — BUPIVACAINE IN DEXTROSE 0.75-8.25 % IT SOLN
INTRATHECAL | Status: DC | PRN
  Administered 2015-10-28: 2 mL via INTRATHECAL

## 2015-10-28 MED ORDER — PROMETHAZINE HCL 25 MG/ML IJ SOLN: 6.2500 mg | INTRAMUSCULAR | Status: DC | PRN

## 2015-10-28 MED ORDER — DIPHENHYDRAMINE HCL 25 MG PO CAPS: 25.0000 mg | ORAL_CAPSULE | Freq: Four times a day (QID) | ORAL | Status: DC | PRN

## 2015-10-28 MED ORDER — POLYETHYLENE GLYCOL 3350 17 G PO PACK
17.0000 g | PACK | Freq: Two times a day (BID) | ORAL | Status: DC
  Administered 2015-10-29: 17 g via ORAL
  Filled 2015-10-28: qty 1

## 2015-10-28 MED ORDER — MENTHOL 3 MG MT LOZG: 1.0000 | LOZENGE | OROMUCOSAL | Status: DC | PRN

## 2015-10-28 MED ORDER — METHOCARBAMOL 1000 MG/10ML IJ SOLN
500.0000 mg | Freq: Four times a day (QID) | INTRAVENOUS | Status: DC | PRN
  Administered 2015-10-28: 500 mg via INTRAVENOUS
  Filled 2015-10-28: qty 550
  Filled 2015-10-28: qty 5

## 2015-10-28 MED ORDER — ONDANSETRON HCL 4 MG/2ML IJ SOLN
INTRAMUSCULAR | Status: DC | PRN
  Administered 2015-10-28: 4 mg via INTRAVENOUS

## 2015-10-28 MED ORDER — MIDAZOLAM HCL 5 MG/5ML IJ SOLN
INTRAMUSCULAR | Status: DC | PRN
  Administered 2015-10-28: 2 mg via INTRAVENOUS

## 2015-10-28 MED ORDER — DEXAMETHASONE SODIUM PHOSPHATE 10 MG/ML IJ SOLN
10.0000 mg | Freq: Once | INTRAMUSCULAR | Status: AC
  Administered 2015-10-29: 10 mg via INTRAVENOUS
  Filled 2015-10-28: qty 1

## 2015-10-28 MED ORDER — FENTANYL CITRATE (PF) 100 MCG/2ML IJ SOLN
INTRAMUSCULAR | Status: AC
  Filled 2015-10-28: qty 2

## 2015-10-28 MED ORDER — LIDOCAINE 2% (20 MG/ML) 5 ML SYRINGE
INTRAMUSCULAR | Status: DC | PRN
  Administered 2015-10-28: 100 mg via INTRAVENOUS

## 2015-10-28 MED ORDER — POTASSIUM CHLORIDE 2 MEQ/ML IV SOLN
100.0000 mL/h | INTRAVENOUS | Status: DC
  Administered 2015-10-28: 100 mL/h via INTRAVENOUS
  Filled 2015-10-28 (×3): qty 1000

## 2015-10-28 MED ORDER — FERROUS SULFATE 325 (65 FE) MG PO TABS
325.0000 mg | ORAL_TABLET | Freq: Three times a day (TID) | ORAL | Status: DC
  Administered 2015-10-28 – 2015-10-29 (×2): 325 mg via ORAL
  Filled 2015-10-28 (×2): qty 1

## 2015-10-28 MED ORDER — CEFAZOLIN SODIUM-DEXTROSE 2-4 GM/100ML-% IV SOLN
INTRAVENOUS | Status: AC
  Filled 2015-10-28: qty 100

## 2015-10-28 MED ORDER — BISACODYL 10 MG RE SUPP: 10.0000 mg | Freq: Every day | RECTAL | Status: DC | PRN

## 2015-10-28 MED ORDER — PHENOL 1.4 % MT LIQD: 1.0000 | OROMUCOSAL | Status: DC | PRN

## 2015-10-28 MED ORDER — CEFAZOLIN SODIUM-DEXTROSE 2-4 GM/100ML-% IV SOLN
2.0000 g | Freq: Four times a day (QID) | INTRAVENOUS | Status: AC
  Administered 2015-10-28 (×2): 2 g via INTRAVENOUS
  Filled 2015-10-28 (×2): qty 100

## 2015-10-28 MED ORDER — ONDANSETRON HCL 4 MG PO TABS: 4.0000 mg | ORAL_TABLET | Freq: Four times a day (QID) | ORAL | Status: DC | PRN

## 2015-10-28 MED ORDER — PHENYLEPHRINE 40 MCG/ML (10ML) SYRINGE FOR IV PUSH (FOR BLOOD PRESSURE SUPPORT)
PREFILLED_SYRINGE | INTRAVENOUS | Status: AC
  Filled 2015-10-28: qty 10

## 2015-10-28 MED ORDER — PHENYLEPHRINE 40 MCG/ML (10ML) SYRINGE FOR IV PUSH (FOR BLOOD PRESSURE SUPPORT)
PREFILLED_SYRINGE | INTRAVENOUS | Status: DC | PRN
  Administered 2015-10-28: 80 ug via INTRAVENOUS

## 2015-10-28 MED ORDER — DEXAMETHASONE SODIUM PHOSPHATE 10 MG/ML IJ SOLN
INTRAMUSCULAR | Status: AC
  Filled 2015-10-28: qty 1

## 2015-10-28 MED ORDER — CELECOXIB 200 MG PO CAPS
200.0000 mg | ORAL_CAPSULE | Freq: Two times a day (BID) | ORAL | Status: DC
  Administered 2015-10-28 – 2015-10-29 (×2): 200 mg via ORAL
  Filled 2015-10-28 (×2): qty 1

## 2015-10-28 MED ORDER — ASPIRIN 81 MG PO CHEW
81.0000 mg | CHEWABLE_TABLET | Freq: Two times a day (BID) | ORAL | Status: DC
  Administered 2015-10-28 – 2015-10-29 (×2): 81 mg via ORAL
  Filled 2015-10-28 (×2): qty 1

## 2015-10-28 MED ORDER — ONDANSETRON HCL 4 MG/2ML IJ SOLN: 4.0000 mg | Freq: Four times a day (QID) | INTRAMUSCULAR | Status: DC | PRN

## 2015-10-28 MED ORDER — HYDROMORPHONE HCL 1 MG/ML IJ SOLN
0.5000 mg | INTRAMUSCULAR | Status: DC | PRN
  Administered 2015-10-28: 1 mg via INTRAVENOUS

## 2015-10-28 MED ORDER — DEXAMETHASONE SODIUM PHOSPHATE 10 MG/ML IJ SOLN
10.0000 mg | Freq: Once | INTRAMUSCULAR | Status: AC
  Administered 2015-10-28: 10 mg via INTRAVENOUS

## 2015-10-28 MED ORDER — PROPOFOL 500 MG/50ML IV EMUL
INTRAVENOUS | Status: DC | PRN
  Administered 2015-10-28: 25 ug/kg/min via INTRAVENOUS

## 2015-10-28 SURGICAL SUPPLY — 37 items
ADH SKN CLS APL DERMABOND .7 (GAUZE/BANDAGES/DRESSINGS) ×1
BAG DECANTER FOR FLEXI CONT (MISCELLANEOUS) IMPLANT
BAG SPEC THK2 15X12 ZIP CLS (MISCELLANEOUS)
BAG ZIPLOCK 12X15 (MISCELLANEOUS) IMPLANT
CAPT HIP TOTAL 2 ×3 IMPLANT
CLOTH BEACON ORANGE TIMEOUT ST (SAFETY) ×3 IMPLANT
COVER PERINEAL POST (MISCELLANEOUS) ×3 IMPLANT
DERMABOND ADVANCED (GAUZE/BANDAGES/DRESSINGS) ×2
DERMABOND ADVANCED .7 DNX12 (GAUZE/BANDAGES/DRESSINGS) ×1 IMPLANT
DRAPE STERI IOBAN 125X83 (DRAPES) ×3 IMPLANT
DRAPE U-SHAPE 47X51 STRL (DRAPES) ×6 IMPLANT
DRESSING AQUACEL AG SP 3.5X10 (GAUZE/BANDAGES/DRESSINGS) ×1 IMPLANT
DRSG AQUACEL AG SP 3.5X10 (GAUZE/BANDAGES/DRESSINGS) ×3
DURAPREP 26ML APPLICATOR (WOUND CARE) ×3 IMPLANT
ELECT REM PT RETURN 15FT ADLT (MISCELLANEOUS) IMPLANT
ELECT REM PT RETURN 9FT ADLT (ELECTROSURGICAL) ×3
ELECTRODE REM PT RTRN 9FT ADLT (ELECTROSURGICAL) ×1 IMPLANT
GLOVE BIOGEL M STRL SZ7.5 (GLOVE) ×3 IMPLANT
GLOVE BIOGEL PI IND STRL 7.5 (GLOVE) ×1 IMPLANT
GLOVE BIOGEL PI IND STRL 8.5 (GLOVE) IMPLANT
GLOVE BIOGEL PI INDICATOR 7.5 (GLOVE) ×2
GLOVE BIOGEL PI INDICATOR 8.5 (GLOVE)
GLOVE ECLIPSE 8.0 STRL XLNG CF (GLOVE) ×6 IMPLANT
GLOVE ORTHO TXT STRL SZ7.5 (GLOVE) ×3 IMPLANT
GOWN STRL REUS W/TWL LRG LVL3 (GOWN DISPOSABLE) ×3 IMPLANT
GOWN STRL REUS W/TWL XL LVL3 (GOWN DISPOSABLE) ×6 IMPLANT
HOLDER FOLEY CATH W/STRAP (MISCELLANEOUS) ×3 IMPLANT
PACK ANTERIOR HIP CUSTOM (KITS) ×3 IMPLANT
SAW OSC TIP CART 19.5X105X1.3 (SAW) ×3 IMPLANT
SUT MNCRL AB 4-0 PS2 18 (SUTURE) ×3 IMPLANT
SUT VIC AB 1 CT1 36 (SUTURE) ×9 IMPLANT
SUT VIC AB 2-0 CT1 27 (SUTURE) ×6
SUT VIC AB 2-0 CT1 TAPERPNT 27 (SUTURE) ×2 IMPLANT
SUT VLOC 180 0 24IN GS25 (SUTURE) ×3 IMPLANT
TRAY FOLEY W/METER SILVER 16FR (SET/KITS/TRAYS/PACK) ×3 IMPLANT
WATER STERILE IRR 1500ML POUR (IV SOLUTION) ×3 IMPLANT
YANKAUER SUCT BULB TIP 10FT TU (MISCELLANEOUS) ×3 IMPLANT

## 2015-10-28 NOTE — Anesthesia Procedure Notes (Signed)
Spinal  Start time: 10/28/2015 9:50 AM End time: 10/28/2015 9:55 AM Staffing Anesthesiologist: Franne Grip Resident/CRNA: Carleene Cooper A Performed: anesthesiologist  Preanesthetic Checklist Completed: patient identified, site marked, surgical consent, pre-op evaluation, timeout performed, IV checked, risks and benefits discussed and monitors and equipment checked Spinal Block Patient position: sitting Prep: Betadine Patient monitoring: heart rate, continuous pulse ox and blood pressure Approach: left paramedian Location: L3-4 Injection technique: single-shot Needle Needle type: Pencan  Needle gauge: 24 G Needle length: 9 cm Additional Notes Kit expiration checked/confirmed. Time out performed. First attempt by CRNA w/o success, same site by Dr. Delma Post w/ clear CSF, neg heme, neg paresthesia. tol well

## 2015-10-28 NOTE — Transfer of Care (Signed)
Immediate Anesthesia Transfer of Care Note  Patient: Barry Roberson  Procedure(s) Performed: Procedure(s): RIGHT TOTAL HIP ARTHROPLASTY ANTERIOR APPROACH (Right)  Patient Location: PACU  Anesthesia Type:Spinal  Level of Consciousness:  sedated, patient cooperative and responds to stimulation  Airway & Oxygen Therapy:Patient Spontanous Breathing and Patient connected to face mask oxgen  Post-op Assessment:  Report given to PACU RN and Post -op Vital signs reviewed and stable  Post vital signs:  Reviewed and stable  Last Vitals:  Vitals:   10/28/15 0723  BP: (!) 149/93  Pulse: 75  Resp: 18  Temp: 123456 C    Complications: No apparent anesthesia complications

## 2015-10-28 NOTE — Op Note (Signed)
NAME:  Barry Roberson                ACCOUNT NO.: [id number]      MEDICAL RECORD NO.: [id number]      FACILITY:  [facility]      PHYSICIAN:  Paralee Cancel D  DATE OF BIRTH:  Jul 15, 1960     DATE OF PROCEDURE:  10/28/2015                                 OPERATIVE REPORT         PREOPERATIVE DIAGNOSIS: Right  hip osteoarthritis.      POSTOPERATIVE DIAGNOSIS:  Right hip osteoarthritis.      PROCEDURE:  Right total hip replacement through an anterior approach   utilizing DePuy THR system, component size 24mm pinnacle cup, a size 36+4 neutral   Altrex liner, a size 4 Hi Tri Lock stem with a 36=1.5 delta ceramic   ball.      SURGEON:  Pietro Cassis. Alvan Dame, M.D.      ASSISTANT:  Molli Barrows, PA-C     ANESTHESIA:  Spinal.      SPECIMENS:  None.      COMPLICATIONS:  None.      BLOOD LOSS:  350 cc     DRAINS:  None.      INDICATION OF THE PROCEDURE:  Barry Roberson is a 55 y.o. male who had   presented to office for evaluation of right hip pain.  Radiographs revealed   progressive degenerative changes with bone-on-bone   articulation to the  hip joint.  The patient had painful limited range of   motion significantly affecting their overall quality of life.  The patient was failing to    respond to conservative measures, and at this point was ready   to proceed with more definitive measures.  The patient has noted progressive   degenerative changes in his hip, progressive problems and dysfunction   with regarding the hip prior to surgery.  Consent was obtained for   benefit of pain relief.  Specific risk of infection, DVT, component   failure, dislocation, need for revision surgery, as well discussion of   the anterior versus posterior approach were reviewed.  Consent was   obtained for benefit of anterior pain relief through an anterior   approach.      PROCEDURE IN DETAIL:  The patient was brought to operative theater.   Once adequate anesthesia, preoperative  antibiotics, 2gm of Ancef, 1 gm of Tranexamic Acid, and 10 mg of Decadron administered.   The patient was positioned supine on the OSI Hanna table.  Once adequate   padding of boney process was carried out, we had predraped out the hip, and  used fluoroscopy to confirm orientation of the pelvis and position.      The right hip was then prepped and draped from proximal iliac crest to   mid thigh with shower curtain technique.      Time-out was performed identifying the patient, planned procedure, and   extremity.     An incision was then made 2 cm distal and lateral to the   anterior superior iliac spine extending over the orientation of the   tensor fascia lata muscle and sharp dissection was carried down to the   fascia of the muscle and protractor placed in the soft tissues.      The fascia was  then incised.  The muscle belly was identified and swept   laterally and retractor placed along the superior neck.  Following   cauterization of the circumflex vessels and removing some pericapsular   fat, a second cobra retractor was placed on the inferior neck.  A third   retractor was placed on the anterior acetabulum after elevating the   anterior rectus.  A L-capsulotomy was along the line of the   superior neck to the trochanteric fossa, then extended proximally and   distally.  Tag sutures were placed and the retractors were then placed   intracapsular.  We then identified the trochanteric fossa and   orientation of my neck cut, confirmed this radiographically   and then made a neck osteotomy with the femur on traction.  The femoral   head was removed without difficulty or complication.  Traction was let   off and retractors were placed posterior and anterior around the   acetabulum.      The labrum and foveal tissue were debrided.  I began reaming with a 99mm   reamer and reamed up to 32mm reamer with good bony bed preparation and a 10mm   cup was chosen.  The final 24mm Pinnacle cup  was then impacted under fluoroscopy  to confirm the depth of penetration and orientation with respect to   abduction.  A screw was placed followed by the hole eliminator.  The final   36+4 neutral Altrex liner was impacted with good visualized rim fit.  The cup was positioned anatomically within the acetabular portion of the pelvis.      At this point, the femur was rolled at 80 degrees.  Further capsule was   released off the inferior aspect of the femoral neck.  I then   released the superior capsule proximally.  The hook was placed laterally   along the femur and elevated manually and held in position with the bed   hook.  The leg was then extended and adducted with the leg rolled to 100   degrees of external rotation.  Once the proximal femur was fully   exposed, I used a box osteotome to set orientation.  I then began   broaching with the starting chili pepper broach and passed this by hand and then broached up to 4 (to match the contra-lateral hip).  With the 4 broach in place I chose a high offset neck and did several trial reductions.  The offset was appropriate, leg lengths   appeared to be equal best matched at this point with the +1.5 head ball, confirmed radiographically.   Given these findings, I went ahead and dislocated the hip, repositioned all   retractors and positioned the right hip in the extended and abducted position.  The final 4Hi Tri Lock stem was   chosen and it was impacted down to the level of neck cut.  Based on this   and the trial reduction, a 36+1.5 delta ceramic ball was chosen and   impacted onto a clean and dry trunnion, and the hip was reduced.  The   hip had been irrigated throughout the case again at this point.  I did   reapproximate the superior capsular leaflet to the anterior leaflet   using #1 Vicryl.  The fascia of the   tensor fascia lata muscle was then reapproximated using #1 Vicryl and #0 V-lock sutures.  The   remaining wound was closed with 2-0  Vicryl and running 4-0 Monocryl.  The hip was cleaned, dried, and dressed sterilely using Dermabond and   Aquacel dressing.  He was then brought   to recovery room in stable condition tolerating the procedure well.    Molli Barrows, PA-C was present for the entirety of the case involved from   preoperative positioning, perioperative retractor management, general   facilitation of the case, as well as primary wound closure as assistant.            Pietro Cassis Alvan Dame, M.D.        10/28/2015 11:11 AM

## 2015-10-28 NOTE — Evaluation (Signed)
Physical Therapy Evaluation Patient Details Name: Barry Roberson MRN: [id number] DOB: 10/09/1960 Today's Date: 10/28/2015   History of Present Illness  55 yo male s/p R THa-direct anterior 10/28/15. Hx of L THA-direct anterior 08/2015  Clinical Impression  On eval POD 0, pt required Min assist for mobility. He walked ~70 feet with a RW. Pain rated 5/10 with activity. Will follow and progress activity as tolerated.     Follow Up Recommendations Home health PT    Equipment Recommendations  None recommended by PT    Recommendations for Other Services       Precautions / Restrictions Precautions Precautions: Fall Restrictions Weight Bearing Restrictions: No RLE Weight Bearing: Weight bearing as tolerated      Mobility  Bed Mobility Overal bed mobility: Needs Assistance Bed Mobility: Supine to Sit     Supine to sit: [facility] elevated;Min assist     General bed mobility comments: assist for R LE. Increased time.   Transfers Overall transfer level: Needs assistance Equipment used: Rolling walker (2 wheeled) Transfers: Sit to/from Stand Sit to Stand: Min assist         General transfer comment: VCs safety, technique, hand/LE placement. Increased time. assist to rise, stabilize, control descent.   Ambulation/Gait Ambulation/Gait assistance: Min guard Ambulation Distance (Feet): 70 Feet Assistive device: Rolling walker (2 wheeled) Gait Pattern/deviations: Step-through pattern;Decreased stride length     General Gait Details: close guard for safety.   Stairs            Wheelchair Mobility    Modified Rankin (Stroke Patients Only)       Balance                                             Pertinent Vitals/Pain Pain Assessment: 0-10 Pain Score: 5  Pain Location: L hip/thigh Pain Descriptors / Indicators: Aching;Sore Pain Intervention(s): Monitored during session;Repositioned;Ice applied    Home Living Family/patient expects to be  discharged to:: Private residence Living Arrangements: Spouse/significant other Available Help at Discharge: Family Type of Home: House Home Access: Stairs to enter Entrance Stairs-Rails: None Entrance Stairs-Number of Steps: 1 Home Layout: Multi-level Home Equipment: Environmental consultant - 2 wheels;Cane - single point;Bedside commode Additional Comments: Pt plans to stay on main level with recliner and half bath    Prior Function Level of Independence: Independent               Hand Dominance        Extremity/Trunk Assessment   Upper Extremity Assessment: Defer to OT evaluation           Lower Extremity Assessment: Generalized weakness      Cervical / Trunk Assessment: Normal  Communication   Communication: No difficulties  Cognition Arousal/Alertness: Awake/alert Behavior During Therapy: WFL for tasks assessed/performed Overall Cognitive Status: Within Functional Limits for tasks assessed                      General Comments      Exercises     Assessment/Plan    PT Assessment Patient needs continued PT services  PT Problem List Decreased strength;Decreased mobility;Decreased range of motion;Decreased activity tolerance;Decreased balance;Pain;Decreased knowledge of use of DME          PT Treatment Interventions DME instruction;Gait training;Therapeutic exercise;Therapeutic activities;Stair training;Functional mobility training;Balance training;Patient/family education    PT Goals (Current goals can be  found in the Care Plan section)  Acute Rehab PT Goals Patient Stated Goal: regain PLOF PT Goal Formulation: With patient/family Time For Goal Achievement: 11/04/15 Potential to Achieve Goals: Good    Frequency 7X/week   Barriers to discharge        Co-evaluation               End of Session Equipment Utilized During Treatment: Gait belt Activity Tolerance: Patient tolerated treatment well Patient left: in chair;with call bell/phone within  reach           Time: [id number] PT Time Calculation (min) (ACUTE ONLY): 26 min   Charges:   PT Evaluation $PT Eval Low Complexity: 1 Procedure PT Treatments $Gait Training: 8-22 mins   PT G Codes:        Weston Anna, MPT Pager: [phone]

## 2015-10-28 NOTE — Interval H&P Note (Signed)
History and Physical Interval Note:  10/28/2015 8:59 AM  Barry Roberson  has presented today for surgery, with the diagnosis of RIGHT HIP OA  The various methods of treatment have been discussed with the patient and family. After consideration of risks, benefits and other options for treatment, the patient has consented to  Procedure(s): RIGHT TOTAL HIP ARTHROPLASTY ANTERIOR APPROACH (Right) as a surgical intervention .  The patient's history has been reviewed, patient examined, no change in status, stable for surgery.  I have reviewed the patient's chart and labs.  Questions were answered to the patient's satisfaction.     [name]

## 2015-10-28 NOTE — Anesthesia Preprocedure Evaluation (Addendum)
Anesthesia Evaluation  Patient identified by MRN, date of birth, ID band Patient awake    Reviewed: Allergy & Precautions, NPO status , Patient's Chart, lab work & pertinent test results  Airway Mallampati: II  TM Distance: >3 FB Neck ROM: Full    Dental no notable dental hx.    Pulmonary Current Smoker,    Pulmonary exam normal breath sounds clear to auscultation       Cardiovascular negative cardio ROS Normal cardiovascular exam Rhythm:Regular Rate:Normal     Neuro/Psych negative neurological ROS  negative psych ROS   GI/Hepatic negative GI ROS, Neg liver ROS,   Endo/Other  negative endocrine ROS  Renal/GU negative Renal ROS  negative genitourinary   Musculoskeletal  (+) Arthritis ,   Abdominal (+) + obese,   Peds negative pediatric ROS (+)  Hematology negative hematology ROS (+)   Anesthesia Other Findings   Reproductive/Obstetrics negative OB ROS                             Anesthesia Physical Anesthesia Plan  ASA: II  Anesthesia Plan: Spinal   Post-op Pain Management:    Induction: Intravenous  Airway Management Planned: Natural Airway  Additional Equipment:   Intra-op Plan:   Post-operative Plan:   Informed Consent: I have reviewed the patients History and Physical, chart, labs and discussed the procedure including the risks, benefits and alternatives for the proposed anesthesia with the patient or authorized representative who has indicated his/her understanding and acceptance.   Dental advisory given  Plan Discussed with: CRNA  Anesthesia Plan Comments: (Discussed risks and benefits of and differences between spinal and general. Discussed risks of spinal including headache, backache, failure, bleeding and hematoma, infection, and nerve damage. Patient consents to spinal. Questions answered. Platelet count acceptable and no h/o excessive bleeding.  Did well with  spinal for other THA last month.)       Anesthesia Quick Evaluation

## 2015-10-28 NOTE — Anesthesia Postprocedure Evaluation (Signed)
Anesthesia Post Note  Patient: Barry Roberson  Procedure(s) Performed: Procedure(s) (LRB): RIGHT TOTAL HIP ARTHROPLASTY ANTERIOR APPROACH (Right)  Patient location during evaluation: PACU Anesthesia Type: Spinal Level of consciousness: oriented and awake and alert Pain management: pain level controlled Vital Signs Assessment: post-procedure vital signs reviewed and stable Respiratory status: spontaneous breathing, respiratory function stable and patient connected to nasal cannula oxygen Cardiovascular status: blood pressure returned to baseline and stable Postop Assessment: no headache, no backache, spinal receding and patient able to bend at knees Anesthetic complications: no    Last Vitals:  Vitals:   10/28/15 1300 10/28/15 1319  BP: 113/73 (!) 120/55  Pulse: 60 60  Resp: 14 16  Temp:  36.7 C    Last Pain:  Vitals:   10/28/15 1319  TempSrc:   PainSc: 4                  Alonna Bartling J

## 2015-10-29 DIAGNOSIS — E669 Obesity, unspecified: Secondary | ICD-10-CM | POA: Diagnosis present

## 2015-10-29 LAB — BASIC METABOLIC PANEL
Anion gap: 4 — ABNORMAL LOW (ref 5–15)
BUN: 22 mg/dL — ABNORMAL HIGH (ref 6–20)
CALCIUM: 8.4 mg/dL — AB (ref 8.9–10.3)
CO2: 24 mmol/L (ref 22–32)
CREATININE: 1.19 mg/dL (ref 0.61–1.24)
Chloride: 107 mmol/L (ref 101–111)
GFR calc Af Amer: >60 mL/min (ref >60)
GFR calc non Af Amer: >60 mL/min (ref >60)
GLUCOSE: 149 mg/dL — AB (ref 65–99)
Potassium: 4.3 mmol/L (ref 3.5–5.1)
Sodium: 135 mmol/L (ref 135–145)

## 2015-10-29 LAB — CBC
HEMATOCRIT: 33 % — AB (ref 39.0–52.0)
Hemoglobin: 11.5 g/dL — ABNORMAL LOW (ref 13.0–17.0)
MCH: 31.9 pg (ref 26.0–34.0)
MCHC: 34.8 g/dL (ref 30.0–36.0)
MCV: 91.7 fL (ref 78.0–100.0)
PLATELETS: 206 10*3/uL (ref 150–400)
RBC: 3.6 MIL/uL — ABNORMAL LOW (ref 4.22–5.81)
RDW: 13 % (ref 11.5–15.5)
WBC: 13.4 10*3/uL — ABNORMAL HIGH (ref 4.0–10.5)

## 2015-10-29 MED ORDER — FERROUS SULFATE 325 (65 FE) MG PO TABS: 325.0000 mg | ORAL_TABLET | Freq: Three times a day (TID) | ORAL | 3 refills | Status: DC

## 2015-10-29 MED ORDER — DOCUSATE SODIUM 100 MG PO CAPS: 100.0000 mg | ORAL_CAPSULE | Freq: Two times a day (BID) | ORAL | 0 refills | Status: DC

## 2015-10-29 MED ORDER — METHOCARBAMOL 500 MG PO TABS: 500.0000 mg | ORAL_TABLET | Freq: Four times a day (QID) | ORAL | 0 refills | Status: DC | PRN

## 2015-10-29 MED ORDER — HYDROCODONE-ACETAMINOPHEN 7.5-325 MG PO TABS: 1.0000 | ORAL_TABLET | ORAL | 0 refills | Status: DC | PRN

## 2015-10-29 MED ORDER — POLYETHYLENE GLYCOL 3350 17 G PO PACK: 17.0000 g | PACK | Freq: Two times a day (BID) | ORAL | 0 refills | Status: DC

## 2015-10-29 MED ORDER — ASPIRIN 81 MG PO CHEW: 81.0000 mg | CHEWABLE_TABLET | Freq: Two times a day (BID) | ORAL | 0 refills | Status: DC

## 2015-10-29 NOTE — Progress Notes (Signed)
OT Cancellation Note  Patient Details Name: Barry Roberson MRN: [id number] DOB: 1960-11-10   Cancelled Treatment:    Reason Eval/Treat Not Completed: OT screened, no needs identified, will sign off.  Pt had L THA 6 weeks ago.  Talisa Petrak 10/29/2015, 8:17 AM  Lesle Chris, OTR/L [phone] 10/29/2015

## 2015-10-29 NOTE — Discharge Instructions (Signed)

## 2015-10-29 NOTE — Care Management Note (Signed)
Case Management Note  Patient Details  Name: Barry Roberson MRN: [id number] Date of Birth: Aug 10, 1960  Subjective/Objective:                  RIGHT TOTAL HIP ARTHROPLASTY ANTERIOR APPROACH (Right)  Action/Plan: Discharge planning Expected Discharge Date:  10/29/15               Expected Discharge Plan:  [place]  In-House Referral:     Discharge planning Services  CM Consult  Post Acute Care Choice:  Home Health Choice offered to:  Patient  DME Arranged:  N/A DME Agency:  NA  HH Arranged:  PT [place] Agency:  [facility]  Status of Service:  Completed, signed off  If discussed at [employer], dates discussed:    Additional Comments: Cm met with pt to offer choice of home health agency.  Pt chooses [facility] to render HHPT.  Referral given to [facility] rep, Tim.  Pt states he has all DME from previous surgery.  No other CM needs were communicated. Dellie Catholic, RN 10/29/2015, 11:57 AM

## 2015-10-29 NOTE — Progress Notes (Signed)
     Subjective: 1 Day Post-Op Procedure(s) (LRB): RIGHT TOTAL HIP ARTHROPLASTY ANTERIOR APPROACH (Right)   Patient reports pain as mild, pain controlled. No events throughout the night. Already feels that the leg lengths are a lot closer to being even.  Looking forward to progressing with PT.  Ready to be discharged home.   Objective:   VITALS:   Vitals:   10/29/15 0115 10/29/15 0618  BP: (!) 106/55 120/68  Pulse: 64 67  Resp: 16 16  Temp: 97.8 F (36.6 C) 97.6 F (36.4 C)    Dorsiflexion/Plantar flexion intact Incision: dressing C/D/I No cellulitis present Compartment soft  LABS  Recent Labs  10/29/15 0429  HGB 11.5*  HCT 33.0*  WBC 13.4*  PLT 206     Recent Labs  10/29/15 0429  NA 135  K 4.3  BUN 22*  CREATININE 1.19  GLUCOSE 149*     Assessment/Plan: 1 Day Post-Op Procedure(s) (LRB): RIGHT TOTAL HIP ARTHROPLASTY ANTERIOR APPROACH (Right) Foley cath d/c'ed Advance diet Up with therapy D/C IV fluids Discharge home with home health Follow up in 2 weeks at [facility]. Follow up with OLIN,Lycia Sachdeva D in 2 weeks.  Contact information:  [facility] [address] [place] [id number]    Obese (BMI 30-39.9) Estimated body mass index is 32.22 kg/m as calculated from the following:   Height as of this encounter: 5' 9.5" (1.765 m).   Weight as of this encounter: 100.4 kg (221 lb 6 oz). Patient also counseled that weight may inhibit the healing process Patient counseled that losing weight will help with future health issues       West Pugh. Bing Duffey   PAC  10/29/2015, 8:26 AM

## 2015-10-29 NOTE — Progress Notes (Signed)
Physical Therapy Treatment Patient Details Name: Barry Roberson MRN: [id number] DOB: August 09, 1960 Today's Date: 10/29/2015    History of Present Illness 55 yo male s/p R THa-direct anterior 10/28/15. Hx of L THA-direct anterior 08/2015    PT Comments    Progressing well with mobility. All education completed. Ready to d/c from PT standpoint-made RN aware.   Follow Up Recommendations  Home health PT     Equipment Recommendations  None recommended by PT    Recommendations for Other Services       Precautions / Restrictions Restrictions Weight Bearing Restrictions: No RLE Weight Bearing: Weight bearing as tolerated    Mobility  Bed Mobility Overal bed mobility: Needs Assistance Bed Mobility: Supine to Sit     Supine to sit: HOB elevated;Supervision     General bed mobility comments: no assist given  Transfers Overall transfer level: Needs assistance Equipment used: Rolling walker (2 wheeled) Transfers: Sit to/from Stand Sit to Stand: Min guard         General transfer comment: close guard for safety. VCs hand placement  Ambulation/Gait Ambulation/Gait assistance: Min guard Ambulation Distance (Feet): 125 Feet Assistive device: Rolling walker (2 wheeled) Gait Pattern/deviations: Step-to pattern;Step-through pattern;Decreased stride length     General Gait Details: close guard for safety.    Stairs Stairs: Yes Stairs assistance: Min guard Stair Management: Forwards;With walker;Step to pattern Number of Stairs: 1 General stair comments: close guard for safety. VCs safety, technique, sequence.   Wheelchair Mobility    Modified Rankin (Stroke Patients Only)       Balance                                    Cognition Arousal/Alertness: Awake/alert Behavior During Therapy: WFL for tasks assessed/performed Overall Cognitive Status: Within Functional Limits for tasks assessed                      Exercises Total Joint  Exercises Ankle Circles/Pumps: AROM;Both;10 reps;Supine Quad Sets: AROM;Both;10 reps;Supine Heel Slides: AAROM;Right;10 reps;Supine Hip ABduction/ADduction: AAROM;Right;10 reps;Supine    General Comments        Pertinent Vitals/Pain Pain Assessment: 0-10 Pain Score: 3  Pain Location: L hip/thigh Pain Descriptors / Indicators: Aching;Sore Pain Intervention(s): Monitored during session;Repositioned;Ice applied    Home Living                      Prior Function            PT Goals (current goals can now be found in the care plan section) Progress towards PT goals: Progressing toward goals    Frequency    7X/week      PT Plan Current plan remains appropriate    Co-evaluation             End of Session   Activity Tolerance: Patient tolerated treatment well Patient left: in chair;with call bell/phone within reach;with family/visitor present     Time: 0856-0920 PT Time Calculation (min) (ACUTE ONLY): 24 min  Charges:  $Gait Training: 8-22 mins $Therapeutic Exercise: 8-22 mins                    G Codes:      Weston Anna, MPT Pager: [phone]

## 2015-10-31 NOTE — Discharge Summary (Signed)
Physician Discharge Summary  Patient ID: Barry Roberson MRN: [id number] DOB/AGE: 03-30-1960 55 y.o.  Admit date: 10/28/2015 Discharge date: 10/29/2015   Procedures:  Procedure(s) (LRB): RIGHT TOTAL HIP ARTHROPLASTY ANTERIOR APPROACH (Right)  Attending Physician:  Dr. Paralee Cancel   Admission Diagnoses:   Right hip primary OA / pain  Discharge Diagnoses:  Principal Problem:   S/P right THA, AA Active Problems:   Obese  Past Medical History:  Diagnosis Date  . Arthritis    oa  . History of bronchitis    10 years ago     HPI:    Barry Roberson, 55 y.o. male, has a history of pain and functional disability in the right hip(s) due to arthritis and patient has failed non-surgical conservative treatments for greater than 12 weeks to include NSAID's and/or analgesics, corticosteriod injections and activity modification.  Onset of symptoms was gradual starting 3-4 years ago with gradually worsening course since that time.The patient noted prior procedures of the hip to include arthroplasty on the left hip(s).  Patient currently rates pain in the right hip at 8 out of 10 with activity. Patient has night pain, worsening of pain with activity and weight bearing, trendelenberg gait, pain that interfers with activities of daily living and pain with passive range of motion. Patient has evidence of periarticular osteophytes and joint space narrowing by imaging studies. This condition presents safety issues increasing the risk of falls.  There is no current active infection.   Risks, benefits and expectations were discussed with the patient.  Risks including but not limited to the risk of anesthesia, blood clots, nerve damage, blood vessel damage, failure of the prosthesis, infection and up to and including death.  Patient understand the risks, benefits and expectations and wishes to proceed with surgery.  PCP: No PCP Per Patient   Discharged Condition: good  Hospital Course:  Patient underwent  the above stated procedure on 10/28/2015. Patient tolerated the procedure well and brought to the recovery room in good condition and subsequently to the floor.  POD #1 BP: 120/68 ; Pulse: 67 ; Temp: 97.6 F (36.4 C) ; Resp: 16 Patient reports pain as mild, pain controlled. No events throughout the night. Already feels that the leg lengths are a lot closer to being even.  Looking forward to progressing with PT.  Ready to be discharged home. Dorsiflexion/plantar flexion intact, incision: dressing C/D/I, no cellulitis present and compartment soft.   LABS  Basename    HGB     11.5  HCT     33.0     Discharge Exam: General appearance: alert, cooperative and no distress Extremities: Homans sign is negative, no sign of DVT, no edema, redness or tenderness in the calves or thighs and no ulcers, gangrene or trophic changes  Disposition: Home with follow up in 2 weeks   Follow-up Information    [name], MD. Schedule an appointment as soon as possible for a visit in 2 week(s).   Specialty:  Orthopedic Surgery Contact information: [address] [place] [address] [phone]        [facility] .   Why:  now known as [facility]; this home health agency will call you to schedule your at home physical therapy Contact information: [place] [address] [phone]           Discharge Instructions    Call MD / Call 911    Complete by:  As  directed    If you experience chest pain or shortness of breath, CALL 911 and be transported to the hospital emergency room.  If you develope a fever above 101 F, pus (white drainage) or increased drainage or redness at the wound, or calf pain, call your surgeon's office.   Change dressing    Complete by:  As directed    Maintain surgical dressing until follow up in the clinic. If the edges start to pull up, may reinforce with tape. If the dressing is no longer working, may remove  and cover with gauze and tape, but must keep the area dry and clean.  Call with any questions or concerns.   Constipation Prevention    Complete by:  As directed    Drink plenty of fluids.  Prune juice may be helpful.  You may use a stool softener, such as Colace (over the counter) 100 mg twice a day.  Use MiraLax (over the counter) for constipation as needed.   Diet - low sodium heart healthy    Complete by:  As directed    Discharge instructions    Complete by:  As directed    Maintain surgical dressing until follow up in the clinic. If the edges start to pull up, may reinforce with tape. If the dressing is no longer working, may remove and cover with gauze and tape, but must keep the area dry and clean.  Follow up in 2 weeks at [facility]. Call with any questions or concerns.   Increase activity slowly as tolerated    Complete by:  As directed    Weight bearing as tolerated with assist device (walker, cane, etc) as directed, use it as long as suggested by your surgeon or therapist, typically at least 4-6 weeks.   TED hose    Complete by:  As directed    Use stockings (TED hose) for 2 weeks on both leg(s).  You may remove them at night for sleeping.        Medication List    STOP taking these medications   acetaminophen 325 MG tablet Commonly known as:  TYLENOL     TAKE these medications   aspirin 81 MG chewable tablet Chew 1 tablet (81 mg total) by mouth 2 (two) times daily.   docusate sodium 100 MG capsule Commonly known as:  COLACE Take 1 capsule (100 mg total) by mouth 2 (two) times daily.   ferrous sulfate 325 (65 FE) MG tablet Take 1 tablet (325 mg total) by mouth 3 (three) times daily after meals.   HYDROcodone-acetaminophen 7.5-325 MG tablet Commonly known as:  NORCO Take 1-2 tablets by mouth every 4 (four) hours as needed for moderate pain. What changed:  reasons to take this   methocarbamol 500 MG tablet Commonly known as:  ROBAXIN Take 1 tablet  (500 mg total) by mouth every 6 (six) hours as needed for muscle spasms.   polyethylene glycol packet Commonly known as:  MIRALAX / GLYCOLAX Take 17 g by mouth 2 (two) times daily.        Signed: West Pugh. Pershing Skidmore   PA-C  10/31/2015, 11:11 PM

## 2017-01-25 HISTORY — PX: LUMBAR FUSION: SHX111

## 2017-02-10 ENCOUNTER — Other Ambulatory Visit: Payer: Self-pay | Admitting: Occupational Medicine

## 2017-02-10 ENCOUNTER — Ambulatory Visit
Admission: RE | Admit: 2017-02-10 | Discharge: 2017-02-10 | Disposition: A | Discharge status: Home / Self Care | Payer: No Typology Code available for payment source | Source: Ambulatory Visit | Attending: Occupational Medicine | Admitting: Occupational Medicine

## 2017-02-10 DIAGNOSIS — Z0289 Encounter for other administrative examinations: Secondary | ICD-10-CM

## 2017-05-04 DIAGNOSIS — Z96641 Presence of right artificial hip joint: Secondary | ICD-10-CM | POA: Diagnosis not present

## 2017-05-04 DIAGNOSIS — M1611 Unilateral primary osteoarthritis, right hip: Secondary | ICD-10-CM | POA: Diagnosis not present

## 2017-05-04 DIAGNOSIS — Z471 Aftercare following joint replacement surgery: Secondary | ICD-10-CM | POA: Diagnosis not present

## 2017-05-04 DIAGNOSIS — M25562 Pain in left knee: Secondary | ICD-10-CM | POA: Diagnosis not present

## 2017-05-30 DIAGNOSIS — M47816 Spondylosis without myelopathy or radiculopathy, lumbar region: Secondary | ICD-10-CM | POA: Diagnosis not present

## 2017-05-30 DIAGNOSIS — M5416 Radiculopathy, lumbar region: Secondary | ICD-10-CM | POA: Diagnosis not present

## 2017-05-30 DIAGNOSIS — M5136 Other intervertebral disc degeneration, lumbar region: Secondary | ICD-10-CM | POA: Diagnosis not present

## 2017-05-30 DIAGNOSIS — M5442 Lumbago with sciatica, left side: Secondary | ICD-10-CM | POA: Diagnosis not present

## 2017-06-01 ENCOUNTER — Other Ambulatory Visit: Payer: Self-pay | Admitting: Neurosurgery

## 2017-06-01 DIAGNOSIS — M5441 Lumbago with sciatica, right side: Secondary | ICD-10-CM

## 2017-06-01 DIAGNOSIS — M5442 Lumbago with sciatica, left side: Principal | ICD-10-CM

## 2017-06-06 ENCOUNTER — Ambulatory Visit
Admission: RE | Admit: 2017-06-06 | Discharge: 2017-06-06 | Disposition: A | Discharge status: Home / Self Care | Payer: 59 | Source: Ambulatory Visit | Attending: Neurosurgery | Admitting: Neurosurgery

## 2017-06-06 DIAGNOSIS — M5442 Lumbago with sciatica, left side: Principal | ICD-10-CM

## 2017-06-06 DIAGNOSIS — M5441 Lumbago with sciatica, right side: Secondary | ICD-10-CM

## 2017-06-15 ENCOUNTER — Ambulatory Visit
Admission: RE | Admit: 2017-06-15 | Discharge: 2017-06-15 | Disposition: A | Discharge status: Home / Self Care | Payer: 59 | Source: Ambulatory Visit | Attending: Neurosurgery | Admitting: Neurosurgery

## 2017-06-15 DIAGNOSIS — M4807 Spinal stenosis, lumbosacral region: Secondary | ICD-10-CM | POA: Diagnosis not present

## 2017-06-15 DIAGNOSIS — M5116 Intervertebral disc disorders with radiculopathy, lumbar region: Secondary | ICD-10-CM | POA: Diagnosis not present

## 2017-06-15 MED ORDER — GADOBENATE DIMEGLUMINE 529 MG/ML IV SOLN
20.0000 mL | Freq: Once | INTRAVENOUS | Status: AC | PRN
  Administered 2017-06-15: 20 mL via INTRAVENOUS

## 2017-06-17 ENCOUNTER — Other Ambulatory Visit: Payer: Self-pay | Admitting: Neurosurgery

## 2017-06-17 DIAGNOSIS — M5442 Lumbago with sciatica, left side: Secondary | ICD-10-CM | POA: Diagnosis not present

## 2017-06-17 DIAGNOSIS — M48062 Spinal stenosis, lumbar region with neurogenic claudication: Secondary | ICD-10-CM | POA: Diagnosis not present

## 2017-06-17 DIAGNOSIS — M47816 Spondylosis without myelopathy or radiculopathy, lumbar region: Secondary | ICD-10-CM | POA: Diagnosis not present

## 2017-07-04 ENCOUNTER — Other Ambulatory Visit: Payer: Self-pay | Admitting: Neurosurgery

## 2017-07-14 NOTE — Pre-Procedure Instructions (Signed)
Barry Roberson  07/14/2017      [id number] - [place], [place]. [place] [address] Phone: [phone] Fax: [phone]  [id number] - [place], [place]. AT [address]. [place] [address] Phone: [phone] Fax: [phone]    Your procedure is scheduled on July 1  Report to [facility] [place].M.  Call this number if you have problems the morning of surgery:  [phone]   Remember:  NOTHING TO EAT OR DRINK AFTER MIDNIGHT    Take these medicines the morning of surgery with A SIP OF WATER  acetaminophen (TYLENOL)  7 days prior to surgery STOP taking any Aspirin(unless otherwise instructed by your surgeon), Aleve, Naproxen, Ibuprofen, Motrin, Advil, Goody's, BC's, all herbal medications, fish oil, and all vitamins    Do not wear jewelry  Do not wear lotions, powders, or COLOGNE, or deodorant.  Men may shave face and neck.  Do not bring valuables to the hospital.  [facility] is not responsible for any belongings or valuables.  Contacts, dentures or bridgework may not be worn into surgery.  Leave your suitcase in the car.  After surgery it may be brought to your room.  For patients admitted to the hospital, discharge time will be determined by your treatment team.  Patients discharged the day of surgery will not be allowed to drive home.    Special instructions:   [facility]- Preparing For Surgery  Before surgery, you can play an important role. Because skin is not sterile, your skin needs to be as free of germs as possible. You can reduce the number of germs on your skin by washing with CHG (chlorahexidine gluconate) Soap before surgery.  CHG is an antiseptic cleaner which kills germs and bonds with the skin to continue killing germs even after washing.    Oral Hygiene is also important to reduce your  risk of infection.  Remember - BRUSH YOUR TEETH THE MORNING OF SURGERY WITH YOUR REGULAR TOOTHPASTE  Please do not use if you have an allergy to CHG or antibacterial soaps. If your skin becomes reddened/irritated stop using the CHG.  Do not shave (including legs and underarms) for at least 48 hours prior to first CHG shower. It is OK to shave your face.  Please follow these instructions carefully.   1. Shower the NIGHT BEFORE SURGERY and the MORNING OF SURGERY with CHG.   2. If you chose to wash your hair, wash your hair first as usual with your normal shampoo.  3. After you shampoo, rinse your hair and body thoroughly to remove the shampoo.  4. Use CHG as you would any other liquid soap. You can apply CHG directly to the skin and wash gently with a scrungie or a clean washcloth.   5. Apply the CHG Soap to your body ONLY FROM THE NECK DOWN.  Do not use on open wounds or open sores. Avoid contact with your eyes, ears, mouth and genitals (private parts). Wash Face and genitals (private parts)  with your normal soap.  6. Wash thoroughly, paying special attention to the area where your surgery will be performed.  7. Thoroughly rinse your body with warm water from the neck down.  8. DO NOT shower/wash with your normal soap after using and rinsing off the CHG Soap.  9. Pat yourself dry with a CLEAN TOWEL.  10. Wear CLEAN  PAJAMAS to bed the night before surgery, wear comfortable clothes the morning of surgery  11. Place CLEAN SHEETS on your bed the night of your first shower and DO NOT SLEEP WITH PETS.    Day of Surgery:  Do not apply any deodorants/lotions.  Please wear clean clothes to the hospital/surgery center.   Remember to brush your teeth WITH YOUR REGULAR TOOTHPASTE.    Please read over the following fact sheets that you were given.

## 2017-07-18 ENCOUNTER — Encounter (HOSPITAL_COMMUNITY): Payer: Self-pay

## 2017-07-18 ENCOUNTER — Encounter (HOSPITAL_COMMUNITY)
Admission: RE | Admit: 2017-07-18 | Discharge: 2017-07-18 | Disposition: A | Discharge status: Home / Self Care | Payer: 59 | Source: Ambulatory Visit | Attending: Neurosurgery | Admitting: Neurosurgery

## 2017-07-18 DIAGNOSIS — M7138 Other bursal cyst, other site: Secondary | ICD-10-CM | POA: Diagnosis not present

## 2017-07-18 DIAGNOSIS — Z0183 Encounter for blood typing: Secondary | ICD-10-CM | POA: Insufficient documentation

## 2017-07-18 DIAGNOSIS — Z96643 Presence of artificial hip joint, bilateral: Secondary | ICD-10-CM | POA: Insufficient documentation

## 2017-07-18 DIAGNOSIS — Z01812 Encounter for preprocedural laboratory examination: Secondary | ICD-10-CM | POA: Diagnosis not present

## 2017-07-18 DIAGNOSIS — F419 Anxiety disorder, unspecified: Secondary | ICD-10-CM | POA: Insufficient documentation

## 2017-07-18 DIAGNOSIS — Z87891 Personal history of nicotine dependence: Secondary | ICD-10-CM | POA: Insufficient documentation

## 2017-07-18 DIAGNOSIS — Z01818 Encounter for other preprocedural examination: Secondary | ICD-10-CM | POA: Insufficient documentation

## 2017-07-18 HISTORY — DX: Dyspnea, unspecified: R06.00

## 2017-07-18 HISTORY — DX: Essential (primary) hypertension: I10

## 2017-07-18 HISTORY — DX: Anxiety disorder, unspecified: F41.9

## 2017-07-18 LAB — TYPE AND SCREEN
ABO/RH(D): O POS
Antibody Screen: NEGATIVE

## 2017-07-18 LAB — HEPATIC FUNCTION PANEL
ALBUMIN: 4 g/dL (ref 3.5–5.0)
ALT: 28 U/L (ref 17–63)
AST: 24 U/L (ref 15–41)
Alkaline Phosphatase: 51 U/L (ref 38–126)
BILIRUBIN DIRECT: 0.2 mg/dL (ref 0.1–0.5)
Indirect Bilirubin: 0.7 mg/dL (ref 0.3–0.9)
Total Bilirubin: 0.9 mg/dL (ref 0.3–1.2)
Total Protein: 6.4 g/dL — ABNORMAL LOW (ref 6.5–8.1)

## 2017-07-18 LAB — CBC
HCT: 42.5 % (ref 39.0–52.0)
Hemoglobin: 15 g/dL (ref 13.0–17.0)
MCH: 32.5 pg (ref 26.0–34.0)
MCHC: 35.3 g/dL (ref 30.0–36.0)
MCV: 92 fL (ref 78.0–100.0)
Platelets: 223 10*3/uL (ref 150–400)
RBC: 4.62 MIL/uL (ref 4.22–5.81)
RDW: 13 % (ref 11.5–15.5)
WBC: 8.2 10*3/uL (ref 4.0–10.5)

## 2017-07-18 LAB — BASIC METABOLIC PANEL
Anion gap: 9 (ref 5–15)
BUN: 14 mg/dL (ref 6–20)
CO2: 24 mmol/L (ref 22–32)
Calcium: 8.9 mg/dL (ref 8.9–10.3)
Chloride: 107 mmol/L (ref 101–111)
Creatinine, Ser: 1.18 mg/dL (ref 0.61–1.24)
GFR calc Af Amer: >60 mL/min (ref >60)
GFR calc non Af Amer: >60 mL/min (ref >60)
Glucose, Bld: 119 mg/dL — ABNORMAL HIGH (ref 65–99)
Potassium: 4 mmol/L (ref 3.5–5.1)
Sodium: 140 mmol/L (ref 135–145)

## 2017-07-18 LAB — SURGICAL PCR SCREEN
MRSA, PCR: NEGATIVE
Staphylococcus aureus: NEGATIVE

## 2017-07-18 LAB — ABO/RH: ABO/RH(D): O POS

## 2017-07-18 MED ORDER — CHLORHEXIDINE GLUCONATE CLOTH 2 % EX PADS: 6.0000 | MEDICATED_PAD | Freq: Once | CUTANEOUS | Status: DC

## 2017-07-18 NOTE — Progress Notes (Signed)
Referred to [facility] pa requarding HTN.Marland Kitchen

## 2017-07-18 NOTE — Progress Notes (Addendum)
Anesthesia PAT Evaluation:  Case:  [id number] Date/Time:  07/25/17 0715   Procedure:  bilateral L4-L5 decompression including resection of bilateral synovial cysts: bilateral L4-L5 PLIF & PLA (N/A Back)   Anesthesia type:  General   Pre-op diagnosis:  Stenosis   Location:  [facility] OR ROOM 20 / [facility]   Surgeon:  Jovita Gamma, MD      DISCUSSION: Patient is a 57 year old male scheduled for the above procedure. History includes former smoker (quit 07/18/16), dyspnea, anxiety, s/p bilateral THA '17. He has had intermittent elevated blood pressure readings, but reportedly has never been diagnosed with hypertension or prescribed any medications for his BP.  He retired from [employer] earlier this year. His back pain was worsening and did not feel that he could pass the physical activity portion of the required testing. He said that prior BP readings have been as high as 160/80.  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 His PAT BP reading at his 2017 appointments were 166/101, 143/78. Today at PAT BP was 190/105 (machine). I rechecked a manual BP on his RUE at the end of his visit while sitting in a reclining chart which was more comfortable for him and got a BP of 158/102. He feels the higher BP results are primarily a result from his severe back pain that is keeping him from sleeping or prolonged sitting or standing. Nikki at Dr. Donnella Bi office said his BP there on 06/17/17 was 178/131.  He denied chest pain. He feels like he has SOB related to his pain, but otherwise had not been SOB. He was walking 1-2 miles/week earlier this year, but over the past two months his pain in his back and BLE has significantly worsened. He doesn't notice any leg weakness, but does report intermittent numbness. He has been taking ibuprofen or Tylenol (alternating them up to Q 3 hours) for the past two months to make the pain  somewhat variable. He has not been prescribed any narcotic pain medication.  He denies alcohol use.  His grandfather had a heart attack around age 29, but lived to his 80s. He does not know of any known CAD history in his parents/siblings. Patient denied known personal history of CHF, MI, CAD, DM. He denied prior cardiac studies.   Patient with elevated BP at PAT today. Although I think pain is a contributing factor, I also think he likely has at least some degree of underlying hypertension based on his reading in 2017 and his reported intermittent reading up to 160/80. His BP on recheck today, while sitting in a recliner, was 158/102 which is still elevated but is overall acceptable for OR. We did discuss that at SBP > 180 or DBP > 110 could result in case cancellation and him needing more prompt evaluation by a PCP. Gave him two options to consider for BP follow-up that would possibly allow easy transition into their primary care practice. Encouraged him to have his BP re-evaluated between now and surgery and if possible try to get in with a medical practice (and if started on a medication then to let me know). If he is not able to do so, then we will check his BP on arrival and plans to proceed would depend on his BP trends that morning. Regardless, he was told that sooner rather than later he should get established with a PCP. He is not using  any tobacco products. Discussed that decongestants and caffeine could also elevate his BP. He will be off NSAIDS for a week before surgery which may help. Discussed that if pain is not controlled before surgery (since off NSAIDS) that he should notify Dr. Sherwood Gambler. Activity tolerance earlier this year was > 4 METS. His EKG does not show any significant abnormalities. Anesthesiologist Dr. Oren Bracket and Dr. Sherwood Gambler are in agreement with above.    VS: BP (!) 190/105 Comment: notified Kathie G RN  Pulse 95   Temp 36.8 C   Resp 20   Ht 5\' 9"  (1.753 m)   Wt 242  lb 3.2 oz (109.9 kg)   SpO2 97%   BMI 35.77 kg/m  Repeat BP 158/102 (manual, RUE). Heart RRR, no murmurs noted. Lungs clear anteriorly. No carotid bruits noted. No pretibial pitting edema.   PROVIDERS: Patient, No Pcp Per   LABS: Labs reviewed: Acceptable for surgery. (all labs ordered are listed, but only abnormal results are displayed)  Labs Reviewed  BASIC METABOLIC PANEL - Abnormal; Notable for the following components:      Result Value   Glucose, Bld 119 (*)    All other components within normal limits  HEPATIC FUNCTION PANEL - Abnormal; Notable for the following components:   Total Protein 6.4 (*)    All other components within normal limits  SURGICAL PCR SCREEN  CBC  TYPE AND SCREEN  ABO/RH    IMAGES: CXR 02/10/17: IMPRESSION: No acute cardiopulmonary abnormality.  MRI L-spine 06/15/17: IMPRESSION: The dominant abnormality is at L4-5, where severe stenosis is due to BILATERAL synovial cysts along with an annular bulge/central annular rent. BILATERAL L4 and L5 neural impingement. See discussion above. Postsurgical change L5-S1, without recurrent protrusion.  EKG: 07/18/17: NSR, non-specific T wave abnormality.    CV: N/A  Past Medical History:  Diagnosis Date  . Anxiety   . Arthritis    oa  . Dyspnea   . History of bronchitis    10 years ago     Past Surgical History:  Procedure Laterality Date  . BACK SURGERY     08/2008 secondary to ruptured L3   . JOINT REPLACEMENT    . TONSILLECTOMY  age 30 or 58  . TOTAL HIP ARTHROPLASTY Left 09/16/2015   Procedure: LEFT TOTAL HIP ARTHROPLASTY ANTERIOR APPROACH;  Surgeon: Paralee Cancel, MD;  Location: [facility];  Service: Orthopedics;  Laterality: Left;  . TOTAL HIP ARTHROPLASTY Right 10/28/2015   Procedure: RIGHT TOTAL HIP ARTHROPLASTY ANTERIOR APPROACH;  Surgeon: Paralee Cancel, MD;  Location: [facility];  Service: Orthopedics;  Laterality: Right;    MEDICATIONS: . acetaminophen (TYLENOL) 325 MG tablet  . ibuprofen  (ADVIL,MOTRIN) 200 MG tablet   . Chlorhexidine Gluconate Cloth 2 % PADS 6 each   And  . Chlorhexidine Gluconate Cloth 2 % PADS 6 each   He has been taking frequent acetaminophen and ibuprofen, and at least at times likely more than the recommended frequently. I added a HFP which showed normal LFTs. I  discussed dangers of not taking acetaminophen and ibuprofen as described. He reports surgeon asked him to hold NSAIDS one week prior to surgery.    George Hugh [facility]Anesthesiology Phone [phone] 07/18/2017 3:56 PM

## 2017-07-25 ENCOUNTER — Other Ambulatory Visit: Payer: Self-pay

## 2017-07-25 ENCOUNTER — Inpatient Hospital Stay (HOSPITAL_COMMUNITY): Payer: 59 | Admitting: Vascular Surgery

## 2017-07-25 ENCOUNTER — Inpatient Hospital Stay (HOSPITAL_COMMUNITY): Payer: 59

## 2017-07-25 ENCOUNTER — Encounter (HOSPITAL_COMMUNITY): Payer: Self-pay | Admitting: *Deleted

## 2017-07-25 ENCOUNTER — Encounter (HOSPITAL_COMMUNITY)
Admission: RE | Disposition: A | Discharge status: Home / Self Care | Payer: Self-pay | Source: Ambulatory Visit | Attending: Neurosurgery

## 2017-07-25 ENCOUNTER — Inpatient Hospital Stay (HOSPITAL_COMMUNITY)
Admission: RE | Admit: 2017-07-25 | Discharge: 2017-07-26 | DRG: 455 | Disposition: A | Discharge status: Home / Self Care | Payer: 59 | Source: Ambulatory Visit | Attending: Neurosurgery | Admitting: Neurosurgery

## 2017-07-25 ENCOUNTER — Inpatient Hospital Stay (HOSPITAL_COMMUNITY): Payer: 59 | Admitting: Certified Registered Nurse Anesthetist

## 2017-07-25 DIAGNOSIS — M7138 Other bursal cyst, other site: Secondary | ICD-10-CM | POA: Diagnosis not present

## 2017-07-25 DIAGNOSIS — I10 Essential (primary) hypertension: Secondary | ICD-10-CM | POA: Diagnosis present

## 2017-07-25 DIAGNOSIS — Z96643 Presence of artificial hip joint, bilateral: Secondary | ICD-10-CM | POA: Diagnosis present

## 2017-07-25 DIAGNOSIS — M549 Dorsalgia, unspecified: Secondary | ICD-10-CM | POA: Diagnosis not present

## 2017-07-25 DIAGNOSIS — M47816 Spondylosis without myelopathy or radiculopathy, lumbar region: Secondary | ICD-10-CM | POA: Diagnosis not present

## 2017-07-25 DIAGNOSIS — M5136 Other intervertebral disc degeneration, lumbar region: Secondary | ICD-10-CM | POA: Diagnosis not present

## 2017-07-25 DIAGNOSIS — Z87891 Personal history of nicotine dependence: Secondary | ICD-10-CM

## 2017-07-25 DIAGNOSIS — M48062 Spinal stenosis, lumbar region with neurogenic claudication: Principal | ICD-10-CM | POA: Diagnosis present

## 2017-07-25 DIAGNOSIS — Z419 Encounter for procedure for purposes other than remedying health state, unspecified: Secondary | ICD-10-CM

## 2017-07-25 DIAGNOSIS — M4316 Spondylolisthesis, lumbar region: Secondary | ICD-10-CM | POA: Diagnosis not present

## 2017-07-25 DIAGNOSIS — Z981 Arthrodesis status: Secondary | ICD-10-CM | POA: Diagnosis not present

## 2017-07-25 SURGERY — POSTERIOR LUMBAR FUSION 1 LEVEL
Anesthesia: General | Site: Back

## 2017-07-25 MED ORDER — ROCURONIUM BROMIDE 10 MG/ML (PF) SYRINGE
PREFILLED_SYRINGE | INTRAVENOUS | Status: AC
  Filled 2017-07-25: qty 20

## 2017-07-25 MED ORDER — MENTHOL 3 MG MT LOZG: 1.0000 | LOZENGE | OROMUCOSAL | Status: DC | PRN

## 2017-07-25 MED ORDER — ROCURONIUM BROMIDE 10 MG/ML (PF) SYRINGE
PREFILLED_SYRINGE | INTRAVENOUS | Status: AC
  Filled 2017-07-25: qty 10

## 2017-07-25 MED ORDER — ONDANSETRON HCL 4 MG/2ML IJ SOLN
INTRAMUSCULAR | Status: DC | PRN
  Administered 2017-07-25 (×2): 4 mg via INTRAVENOUS

## 2017-07-25 MED ORDER — SODIUM CHLORIDE 0.9% FLUSH
3.0000 mL | Freq: Two times a day (BID) | INTRAVENOUS | Status: DC
  Administered 2017-07-25: 3 mL via INTRAVENOUS

## 2017-07-25 MED ORDER — CYCLOBENZAPRINE HCL 5 MG PO TABS
5.0000 mg | ORAL_TABLET | Freq: Three times a day (TID) | ORAL | Status: DC | PRN
  Administered 2017-07-25: 5 mg via ORAL
  Filled 2017-07-25: qty 1

## 2017-07-25 MED ORDER — FENTANYL CITRATE (PF) 250 MCG/5ML IJ SOLN
INTRAMUSCULAR | Status: AC
  Filled 2017-07-25: qty 5

## 2017-07-25 MED ORDER — SODIUM CHLORIDE 0.9 % IV SOLN
INTRAVENOUS | Status: DC | PRN
  Administered 2017-07-25: 500 mL

## 2017-07-25 MED ORDER — ONDANSETRON HCL 4 MG/2ML IJ SOLN
INTRAMUSCULAR | Status: AC
  Filled 2017-07-25: qty 2

## 2017-07-25 MED ORDER — FLEET ENEMA 7-19 GM/118ML RE ENEM: 1.0000 | ENEMA | Freq: Once | RECTAL | Status: DC | PRN

## 2017-07-25 MED ORDER — KETOROLAC TROMETHAMINE 30 MG/ML IJ SOLN
30.0000 mg | Freq: Four times a day (QID) | INTRAMUSCULAR | Status: DC
  Administered 2017-07-25 – 2017-07-26 (×3): 30 mg via INTRAVENOUS
  Filled 2017-07-25 (×3): qty 1

## 2017-07-25 MED ORDER — DIAZEPAM 5 MG/ML IJ SOLN
2.5000 mg | Freq: Once | INTRAMUSCULAR | Status: AC
  Administered 2017-07-25: 2.5 mg via INTRAVENOUS

## 2017-07-25 MED ORDER — THROMBIN 5000 UNITS EX SOLR
CUTANEOUS | Status: AC
  Filled 2017-07-25: qty 5000

## 2017-07-25 MED ORDER — DEXAMETHASONE SODIUM PHOSPHATE 10 MG/ML IJ SOLN
INTRAMUSCULAR | Status: AC
  Filled 2017-07-25: qty 1

## 2017-07-25 MED ORDER — ARTIFICIAL TEARS OPHTHALMIC OINT
TOPICAL_OINTMENT | OPHTHALMIC | Status: DC | PRN
  Administered 2017-07-25: 1 via OPHTHALMIC

## 2017-07-25 MED ORDER — MIDAZOLAM HCL 2 MG/2ML IJ SOLN
INTRAMUSCULAR | Status: AC
  Filled 2017-07-25: qty 2

## 2017-07-25 MED ORDER — BUPIVACAINE HCL (PF) 0.5 % IJ SOLN
INTRAMUSCULAR | Status: DC | PRN
  Administered 2017-07-25: 15 mL

## 2017-07-25 MED ORDER — LIDOCAINE 2% (20 MG/ML) 5 ML SYRINGE
INTRAMUSCULAR | Status: AC
  Filled 2017-07-25: qty 5

## 2017-07-25 MED ORDER — HYDROCODONE-ACETAMINOPHEN 5-325 MG PO TABS
1.0000 | ORAL_TABLET | ORAL | Status: DC | PRN
  Administered 2017-07-25 – 2017-07-26 (×3): 1 via ORAL
  Filled 2017-07-25 (×3): qty 1

## 2017-07-25 MED ORDER — THROMBIN 20000 UNITS EX SOLR
CUTANEOUS | Status: DC | PRN
  Administered 2017-07-25: 09:00:00 via TOPICAL

## 2017-07-25 MED ORDER — PROPOFOL 10 MG/ML IV BOLUS
INTRAVENOUS | Status: DC | PRN
  Administered 2017-07-25: 200 mg via INTRAVENOUS

## 2017-07-25 MED ORDER — BUPIVACAINE HCL (PF) 0.5 % IJ SOLN
INTRAMUSCULAR | Status: AC
  Filled 2017-07-25: qty 30

## 2017-07-25 MED ORDER — THROMBIN 20000 UNITS EX SOLR
CUTANEOUS | Status: AC
  Filled 2017-07-25: qty 20000

## 2017-07-25 MED ORDER — LIDOCAINE 2% (20 MG/ML) 5 ML SYRINGE
INTRAMUSCULAR | Status: DC | PRN
  Administered 2017-07-25: 60 mg via INTRAVENOUS

## 2017-07-25 MED ORDER — MIDAZOLAM HCL 2 MG/2ML IJ SOLN
INTRAMUSCULAR | Status: DC | PRN
  Administered 2017-07-25: 2 mg via INTRAVENOUS

## 2017-07-25 MED ORDER — KETOROLAC TROMETHAMINE 30 MG/ML IJ SOLN
30.0000 mg | Freq: Once | INTRAMUSCULAR | Status: AC
Start: 2017-07-25 — End: 2017-07-25
  Administered 2017-07-25: 30 mg via INTRAVENOUS
  Filled 2017-07-25: qty 1

## 2017-07-25 MED ORDER — SUGAMMADEX SODIUM 200 MG/2ML IV SOLN
INTRAVENOUS | Status: AC
  Filled 2017-07-25: qty 2

## 2017-07-25 MED ORDER — FENTANYL CITRATE (PF) 250 MCG/5ML IJ SOLN
INTRAMUSCULAR | Status: DC | PRN
  Administered 2017-07-25 (×5): 50 ug via INTRAVENOUS
  Administered 2017-07-25: 100 ug via INTRAVENOUS

## 2017-07-25 MED ORDER — PROMETHAZINE HCL 25 MG/ML IJ SOLN: 6.2500 mg | INTRAMUSCULAR | Status: DC | PRN

## 2017-07-25 MED ORDER — PHENOL 1.4 % MT LIQD: 1.0000 | OROMUCOSAL | Status: DC | PRN

## 2017-07-25 MED ORDER — CEFAZOLIN SODIUM-DEXTROSE 2-4 GM/100ML-% IV SOLN
2.0000 g | INTRAVENOUS | Status: AC
  Administered 2017-07-25: 2 g via INTRAVENOUS
  Filled 2017-07-25: qty 100

## 2017-07-25 MED ORDER — MORPHINE SULFATE (PF) 4 MG/ML IV SOLN: 4.0000 mg | INTRAVENOUS | Status: DC | PRN

## 2017-07-25 MED ORDER — ALBUMIN HUMAN 5 % IV SOLN
INTRAVENOUS | Status: DC | PRN
  Administered 2017-07-25: 10:00:00 via INTRAVENOUS

## 2017-07-25 MED ORDER — LIDOCAINE-EPINEPHRINE 1 %-1:100000 IJ SOLN
INTRAMUSCULAR | Status: AC
  Filled 2017-07-25: qty 1

## 2017-07-25 MED ORDER — ROCURONIUM BROMIDE 10 MG/ML (PF) SYRINGE
PREFILLED_SYRINGE | INTRAVENOUS | Status: DC | PRN
  Administered 2017-07-25: 40 mg via INTRAVENOUS
  Administered 2017-07-25: 20 mg via INTRAVENOUS
  Administered 2017-07-25: 30 mg via INTRAVENOUS
  Administered 2017-07-25: 20 mg via INTRAVENOUS
  Administered 2017-07-25: 70 mg via INTRAVENOUS
  Administered 2017-07-25: 40 mg via INTRAVENOUS

## 2017-07-25 MED ORDER — SODIUM CHLORIDE 0.9 % IJ SOLN
INTRAMUSCULAR | Status: AC
  Filled 2017-07-25: qty 20

## 2017-07-25 MED ORDER — SODIUM CHLORIDE 0.9 % IV SOLN: 250.0000 mL | INTRAVENOUS | Status: DC

## 2017-07-25 MED ORDER — HYDROMORPHONE HCL 1 MG/ML IJ SOLN
0.2500 mg | INTRAMUSCULAR | Status: DC | PRN
  Administered 2017-07-25 (×2): 0.5 mg via INTRAVENOUS

## 2017-07-25 MED ORDER — PROPOFOL 10 MG/ML IV BOLUS
INTRAVENOUS | Status: AC
  Filled 2017-07-25: qty 20

## 2017-07-25 MED ORDER — DIAZEPAM 5 MG/ML IJ SOLN
INTRAMUSCULAR | Status: AC
  Filled 2017-07-25: qty 2

## 2017-07-25 MED ORDER — ALUM & MAG HYDROXIDE-SIMETH 200-200-20 MG/5ML PO SUSP: 30.0000 mL | Freq: Four times a day (QID) | ORAL | Status: DC | PRN

## 2017-07-25 MED ORDER — ACETAMINOPHEN 325 MG PO TABS: 650.0000 mg | ORAL_TABLET | ORAL | Status: DC | PRN

## 2017-07-25 MED ORDER — PHENYLEPHRINE HCL 10 MG/ML IJ SOLN
INTRAVENOUS | Status: DC | PRN
  Administered 2017-07-25: 25 ug/min via INTRAVENOUS

## 2017-07-25 MED ORDER — ACETAMINOPHEN 10 MG/ML IV SOLN
INTRAVENOUS | Status: DC | PRN
  Administered 2017-07-25: 1000 mg via INTRAVENOUS

## 2017-07-25 MED ORDER — LACTATED RINGERS IV SOLN
INTRAVENOUS | Status: DC | PRN
  Administered 2017-07-25 (×2): via INTRAVENOUS

## 2017-07-25 MED ORDER — SODIUM CHLORIDE 0.9% FLUSH: 3.0000 mL | INTRAVENOUS | Status: DC | PRN

## 2017-07-25 MED ORDER — PHENYLEPHRINE 40 MCG/ML (10ML) SYRINGE FOR IV PUSH (FOR BLOOD PRESSURE SUPPORT)
PREFILLED_SYRINGE | INTRAVENOUS | Status: DC | PRN
  Administered 2017-07-25 (×2): 80 ug via INTRAVENOUS

## 2017-07-25 MED ORDER — HYDROMORPHONE HCL 1 MG/ML IJ SOLN
INTRAMUSCULAR | Status: AC
  Filled 2017-07-25: qty 1

## 2017-07-25 MED ORDER — ONDANSETRON HCL 4 MG/2ML IJ SOLN: INTRAMUSCULAR | Status: DC | PRN

## 2017-07-25 MED ORDER — HYDROXYZINE HCL 50 MG/ML IM SOLN: 50.0000 mg | INTRAMUSCULAR | Status: DC | PRN

## 2017-07-25 MED ORDER — BISACODYL 10 MG RE SUPP: 10.0000 mg | Freq: Every day | RECTAL | Status: DC | PRN

## 2017-07-25 MED ORDER — ACETAMINOPHEN 650 MG RE SUPP: 650.0000 mg | RECTAL | Status: DC | PRN

## 2017-07-25 MED ORDER — SUGAMMADEX SODIUM 200 MG/2ML IV SOLN
INTRAVENOUS | Status: DC | PRN
  Administered 2017-07-25: 225 mg via INTRAVENOUS

## 2017-07-25 MED ORDER — PHENYLEPHRINE 40 MCG/ML (10ML) SYRINGE FOR IV PUSH (FOR BLOOD PRESSURE SUPPORT)
PREFILLED_SYRINGE | INTRAVENOUS | Status: AC
  Filled 2017-07-25: qty 10

## 2017-07-25 MED ORDER — THROMBIN 5000 UNITS EX SOLR
OROMUCOSAL | Status: DC | PRN
  Administered 2017-07-25: 09:00:00 via TOPICAL

## 2017-07-25 MED ORDER — MAGNESIUM HYDROXIDE 400 MG/5ML PO SUSP: 30.0000 mL | Freq: Every day | ORAL | Status: DC | PRN

## 2017-07-25 MED ORDER — KCL IN DEXTROSE-NACL 20-5-0.45 MEQ/L-%-% IV SOLN
INTRAVENOUS | Status: DC
  Administered 2017-07-25: 15:00:00 via INTRAVENOUS
  Filled 2017-07-25: qty 1000

## 2017-07-25 MED ORDER — EPHEDRINE SULFATE 50 MG/ML IJ SOLN
INTRAMUSCULAR | Status: AC
  Filled 2017-07-25: qty 1

## 2017-07-25 MED ORDER — DEXAMETHASONE SODIUM PHOSPHATE 10 MG/ML IJ SOLN
INTRAMUSCULAR | Status: DC | PRN
  Administered 2017-07-25: 10 mg via INTRAVENOUS

## 2017-07-25 MED ORDER — LACTATED RINGERS IV SOLN
INTRAVENOUS | Status: DC | PRN
  Administered 2017-07-25: 07:00:00 via INTRAVENOUS

## 2017-07-25 MED ORDER — ARTIFICIAL TEARS OPHTHALMIC OINT
TOPICAL_OINTMENT | OPHTHALMIC | Status: AC
  Filled 2017-07-25: qty 3.5

## 2017-07-25 MED ORDER — HYDROXYZINE HCL 25 MG PO TABS: 50.0000 mg | ORAL_TABLET | ORAL | Status: DC | PRN

## 2017-07-25 MED ORDER — LIDOCAINE-EPINEPHRINE 1 %-1:100000 IJ SOLN
INTRAMUSCULAR | Status: DC | PRN
  Administered 2017-07-25: 15 mL

## 2017-07-25 MED ORDER — 0.9 % SODIUM CHLORIDE (POUR BTL) OPTIME
TOPICAL | Status: DC | PRN
  Administered 2017-07-25: 2000 mL

## 2017-07-25 SURGICAL SUPPLY — 72 items
ADH SKN CLS APL DERMABOND .7 (GAUZE/BANDAGES/DRESSINGS) ×1
APL SKNCLS STERI-STRIP NONHPOA (GAUZE/BANDAGES/DRESSINGS) ×1
BAG DECANTER FOR FLEXI CONT (MISCELLANEOUS) ×2 IMPLANT
BENZOIN TINCTURE PRP APPL 2/3 (GAUZE/BANDAGES/DRESSINGS) ×2 IMPLANT
BLADE CLIPPER SURG (BLADE) IMPLANT
BUR ACRON 5.0MM COATED (BURR) ×2 IMPLANT
BUR MATCHSTICK NEURO 3.0 LAGG (BURR) ×2 IMPLANT
CAGE POST LUM 11X28X9 6D (Cage) ×4 IMPLANT
CANISTER SUCT 3000ML PPV (MISCELLANEOUS) ×2 IMPLANT
CAP LCK SPNE (Orthopedic Implant) ×4 IMPLANT
CAP LOCK SPINE RADIUS (Orthopedic Implant) ×4 IMPLANT
CAP LOCKING (Orthopedic Implant) ×8 IMPLANT
CARTRIDGE OIL MAESTRO DRILL (MISCELLANEOUS) ×1 IMPLANT
CONT SPEC 4OZ CLIKSEAL STRL BL (MISCELLANEOUS) ×2 IMPLANT
COVER BACK TABLE 60X90IN (DRAPES) ×2 IMPLANT
DECANTER SPIKE VIAL GLASS SM (MISCELLANEOUS) ×2 IMPLANT
DERMABOND ADVANCED (GAUZE/BANDAGES/DRESSINGS) ×1
DERMABOND ADVANCED .7 DNX12 (GAUZE/BANDAGES/DRESSINGS) ×1 IMPLANT
DIFFUSER DRILL AIR PNEUMATIC (MISCELLANEOUS) ×2 IMPLANT
DRAPE C-ARM 42X72 X-RAY (DRAPES) ×4 IMPLANT
DRAPE C-ARMOR (DRAPES) IMPLANT
DRAPE HALF SHEET 40X57 (DRAPES) IMPLANT
DRAPE LAPAROTOMY 100X72X124 (DRAPES) ×2 IMPLANT
DRAPE POUCH INSTRU U-SHP 10X18 (DRAPES) ×2 IMPLANT
ELECT BLADE 4.0 EZ CLEAN MEGAD (MISCELLANEOUS) ×2
ELECT REM PT RETURN 9FT ADLT (ELECTROSURGICAL) ×2
ELECTRODE BLDE 4.0 EZ CLN MEGD (MISCELLANEOUS) ×1 IMPLANT
ELECTRODE REM PT RTRN 9FT ADLT (ELECTROSURGICAL) ×1 IMPLANT
GAUZE SPONGE 4X4 12PLY STRL (GAUZE/BANDAGES/DRESSINGS) ×2 IMPLANT
GAUZE SPONGE 4X4 16PLY XRAY LF (GAUZE/BANDAGES/DRESSINGS) IMPLANT
GLOVE BIO SURGEON STRL SZ8 (GLOVE) ×2 IMPLANT
GLOVE BIO SURGEON STRL SZ8.5 (GLOVE) ×2 IMPLANT
GLOVE BIOGEL PI IND STRL 8 (GLOVE) ×4 IMPLANT
GLOVE BIOGEL PI INDICATOR 8 (GLOVE) ×4
GLOVE ECLIPSE 7.5 STRL STRAW (GLOVE) ×8 IMPLANT
GOWN STRL REUS W/ TWL LRG LVL3 (GOWN DISPOSABLE) IMPLANT
GOWN STRL REUS W/ TWL XL LVL3 (GOWN DISPOSABLE) ×3 IMPLANT
GOWN STRL REUS W/TWL 2XL LVL3 (GOWN DISPOSABLE) ×6 IMPLANT
GOWN STRL REUS W/TWL LRG LVL3 (GOWN DISPOSABLE)
GOWN STRL REUS W/TWL XL LVL3 (GOWN DISPOSABLE) ×3
KIT BASIN OR (CUSTOM PROCEDURE TRAY) ×2 IMPLANT
KIT INFUSE X SMALL 1.4CC (Orthopedic Implant) ×2 IMPLANT
KIT TURNOVER KIT B (KITS) ×2 IMPLANT
MILL MEDIUM DISP (BLADE) ×2 IMPLANT
NEEDLE ASP BONE MRW 8GX15 (NEEDLE) ×2 IMPLANT
NEEDLE SPNL 18GX3.5 QUINCKE PK (NEEDLE) ×4 IMPLANT
NEEDLE SPNL 22GX3.5 QUINCKE BK (NEEDLE) ×2 IMPLANT
NS IRRIG 1000ML POUR BTL (IV SOLUTION) ×4 IMPLANT
OIL CARTRIDGE MAESTRO DRILL (MISCELLANEOUS) ×2
PACK LAMINECTOMY NEURO (CUSTOM PROCEDURE TRAY) ×2 IMPLANT
PAD ARMBOARD 7.5X6 YLW CONV (MISCELLANEOUS) ×6 IMPLANT
PATTIES SURGICAL .5 X.5 (GAUZE/BANDAGES/DRESSINGS) IMPLANT
PATTIES SURGICAL .5 X1 (DISPOSABLE) IMPLANT
PATTIES SURGICAL 1X1 (DISPOSABLE) IMPLANT
ROD RADIUS 35MM (Rod) ×4 IMPLANT
SCREW 5.75X40M (Screw) ×4 IMPLANT
SCREW 6.75X40MM (Screw) ×4 IMPLANT
SPONGE LAP 4X18 RFD (DISPOSABLE) IMPLANT
SPONGE NEURO XRAY DETECT 1X3 (DISPOSABLE) ×2 IMPLANT
SPONGE SURGIFOAM ABS GEL 100 (HEMOSTASIS) ×2 IMPLANT
STRIP BIOACTIVE VITOSS 25X100X (Neuro Prosthesis/Implant) ×2 IMPLANT
STRIP BIOACTIVE VITOSS 25X52X4 (Orthopedic Implant) ×2 IMPLANT
SUT VIC AB 1 CT1 18XBRD ANBCTR (SUTURE) ×3 IMPLANT
SUT VIC AB 1 CT1 8-18 (SUTURE) ×6
SUT VIC AB 2-0 CP2 18 (SUTURE) ×4 IMPLANT
SYR 3ML LL SCALE MARK (SYRINGE) ×12 IMPLANT
SYR CONTROL 10ML LL (SYRINGE) ×2 IMPLANT
TAPE CLOTH SURG 4X10 WHT LF (GAUZE/BANDAGES/DRESSINGS) ×2 IMPLANT
TOWEL GREEN STERILE (TOWEL DISPOSABLE) IMPLANT
TOWEL GREEN STERILE FF (TOWEL DISPOSABLE) ×2 IMPLANT
TRAY FOLEY MTR SLVR 16FR STAT (SET/KITS/TRAYS/PACK) ×2 IMPLANT
WATER STERILE IRR 1000ML POUR (IV SOLUTION) ×2 IMPLANT

## 2017-07-25 NOTE — Anesthesia Preprocedure Evaluation (Addendum)
Anesthesia Evaluation  Patient identified by MRN, date of birth, ID band Patient awake    Reviewed: Allergy & Precautions, NPO status , Patient's Chart, lab work & pertinent test results  Airway Mallampati: II  TM Distance: >3 FB Neck ROM: Full    Dental no notable dental hx.    Pulmonary neg pulmonary ROS, former smoker,    Pulmonary exam normal breath sounds clear to auscultation       Cardiovascular hypertension, Normal cardiovascular exam Rhythm:Regular Rate:Normal  untreated   Neuro/Psych negative neurological ROS  negative psych ROS   GI/Hepatic negative GI ROS, Neg liver ROS,   Endo/Other  obesity  Renal/GU negative Renal ROS  negative genitourinary   Musculoskeletal negative musculoskeletal ROS (+)   Abdominal   Peds negative pediatric ROS (+)  Hematology negative hematology ROS (+)   Anesthesia Other Findings   Reproductive/Obstetrics negative OB ROS                            Anesthesia Physical Anesthesia Plan  ASA: III  Anesthesia Plan: General   Post-op Pain Management:    Induction: Intravenous  PONV Risk Score and Plan: 2 and Ondansetron, Dexamethasone and Treatment may vary due to age or medical condition  Airway Management Planned: Oral ETT  Additional Equipment:   Intra-op Plan:   Post-operative Plan: Extubation in OR  Informed Consent: I have reviewed the patients History and Physical, chart, labs and discussed the procedure including the risks, benefits and alternatives for the proposed anesthesia with the patient or authorized representative who has indicated his/her understanding and acceptance.   Dental advisory given  Plan Discussed with: CRNA and Surgeon  Anesthesia Plan Comments:        Anesthesia Quick Evaluation

## 2017-07-25 NOTE — Anesthesia Postprocedure Evaluation (Signed)
Anesthesia Post Note  Patient: Barry Roberson  Procedure(s) Performed: bilateral Lumbar Four-Lumbar Five decompression including resection of bilateral synovial cysts: bilateral Lumbar Four-Lumbar Five Posterior Lumbar Interbody Fusion & Posterior Lateral Arthrodesis (N/A Back)     Patient location during evaluation: PACU Anesthesia Type: General Level of consciousness: awake and alert Pain management: pain level controlled Vital Signs Assessment: post-procedure vital signs reviewed and stable Respiratory status: spontaneous breathing, nonlabored ventilation, respiratory function stable and patient connected to nasal cannula oxygen Cardiovascular status: blood pressure returned to baseline and stable Postop Assessment: no apparent nausea or vomiting Anesthetic complications: no    Last Vitals:  Vitals:   07/25/17 1319 07/25/17 1358  BP: 135/88 (!) 144/89  Pulse: 64 72  Resp: 10 16  Temp: (!) 36.4 C 36.7 C  SpO2: 98% 94%    Last Pain:  Vitals:   07/25/17 1358  TempSrc: Oral  PainSc:                  Reco Shonk S

## 2017-07-25 NOTE — Transfer of Care (Signed)
Immediate Anesthesia Transfer of Care Note  Patient: Barry Roberson  Procedure(s) Performed: bilateral Lumbar Four-Lumbar Five decompression including resection of bilateral synovial cysts: bilateral Lumbar Four-Lumbar Five Posterior Lumbar Interbody Fusion & Posterior Lateral Arthrodesis (N/A Back)  Patient Location: PACU  Anesthesia Type:General  Level of Consciousness: awake, alert  and oriented  Airway & Oxygen Therapy: Patient Spontanous Breathing and Patient connected to nasal cannula oxygen  Post-op Assessment: Report given to RN and Post -op Vital signs reviewed and stable  Post vital signs: Reviewed and stable  Last Vitals:  Vitals Value Taken Time  BP    Temp    Pulse 81 07/25/2017 12:05 PM  Resp 30 07/25/2017 12:05 PM  SpO2 97 % 07/25/2017 12:05 PM  Vitals shown include unvalidated device data.  Last Pain:  Vitals:   07/25/17 0636  TempSrc:   PainSc: 4          Complications: No apparent anesthesia complications

## 2017-07-25 NOTE — H&P (Signed)
Subjective: Patient is a 57 y.o. right-handed white male who is admitted for treatment of severe L4-5 multifactorial lumbar stenosis attributed to by large bilateral L4-5 synovial cyst and with associated grade 1 dynamic degenerative spondylolisthesis.  Symptomatically he has had progressively worsening neurogenic claudication.  MRI scan shows bilateral hypertrophic facet arthropathy, with large bilateral synovial cysts emanating from the arthropathic facet joints causing severe thecal sac compression.  X-rays show a grade 1 dynamic degenerative spondylosis of L4-5 secondary to his facet arthropathy.  Patient is admitted now for lumbar decompression and stabilization, specifically bilateral L4-5 lumbar decompression including laminectomy, facetectomy, foraminotomies, resection of the synovial cyst, and bilateral L4-5 posterior lumbar interbody arthrodesis with interbody implants and bone graft and bilateral L4-5 posterior lateral arthrodesis with posterior instrumentation and bone graft.   Patient Active Problem List   Diagnosis Date Noted  . Obese 10/29/2015  . S/P right THA, AA 10/28/2015  . Status post total replacement of left hip 09/16/2015   Past Medical History:  Diagnosis Date  . Anxiety   . Arthritis    oa  . Dyspnea   . History of bronchitis    10 years ago   . Hypertension     Past Surgical History:  Procedure Laterality Date  . BACK SURGERY     08/2008 secondary to ruptured L3   . JOINT REPLACEMENT    . TONSILLECTOMY  age 61 or 50  . TOTAL HIP ARTHROPLASTY Left 09/16/2015   Procedure: LEFT TOTAL HIP ARTHROPLASTY ANTERIOR APPROACH;  Surgeon: Paralee Cancel, MD;  Location: [facility];  Service: Orthopedics;  Laterality: Left;  . TOTAL HIP ARTHROPLASTY Right 10/28/2015   Procedure: RIGHT TOTAL HIP ARTHROPLASTY ANTERIOR APPROACH;  Surgeon: Paralee Cancel, MD;  Location: [facility];  Service: Orthopedics;  Laterality: Right;    Medications Prior to Admission  Medication Sig Dispense Refill Last  Dose  . acetaminophen (TYLENOL) 325 MG tablet Take 325-650 mg by mouth every 4 (four) hours as needed for mild pain or moderate pain.   07/24/2017 at Unknown time  . ibuprofen (ADVIL,MOTRIN) 200 MG tablet Take 800 mg by mouth every 8 (eight) hours as needed for mild pain or moderate pain.   Past Week at Unknown time   No Known Allergies  Social History   Tobacco Use  . Smoking status: Former Smoker    Packs/day: 0.50    Years: 30.00    Pack years: 15.00    Types: Cigarettes    Last attempt to quit: 07/18/2016    Years since quitting: 1.0  . Smokeless tobacco: Never Used  Substance Use Topics  . Alcohol use: No    History reviewed. No pertinent family history.   Review of Systems Pertinent items noted in HPI and remainder of comprehensive ROS otherwise negative.  Objective: Vital signs in last 24 hours: Temp:  [98.6 F (37 C)] 98.6 F (37 C) (07/01 0555) Pulse Rate:  [86] 86 (07/01 0555) Resp:  [18] 18 (07/01 0555) BP: (183)/(119) 183/119 (07/01 0555) SpO2:  [98 %] 98 % (07/01 0555) Weight:  [109.9 kg (242 lb 3.2 oz)] 109.9 kg (242 lb 3.2 oz) (07/01 0618)  EXAM: Patient well-developed well-nourished white male in no acute distress.   Lungs are clear to auscultation , the patient has symmetrical respiratory excursion. Heart has a regular rate and rhythm normal S1 and S2 no murmur.   Abdomen is soft nontender nondistended bowel sounds are present. Extremity examination shows no clubbing cyanosis or edema. Motor examination shows 5 over  5 strength in the lower extremities including the iliopsoas quadriceps dorsiflexor extensor hallicus  longus and plantar flexor bilaterally. Sensation is intact to pinprick in the distal lower extremities. Reflexes are symmetrical bilaterally. No pathologic reflexes are present. Patient has a normal gait and stance.   Data Review:CBC    Component Value Date/Time   WBC 8.2 07/18/2017 0918   RBC 4.62 07/18/2017 0918   HGB 15.0 07/18/2017 0918    HCT 42.5 07/18/2017 0918   PLT 223 07/18/2017 0918   MCV 92.0 07/18/2017 0918   MCH 32.5 07/18/2017 0918   MCHC 35.3 07/18/2017 0918   RDW 13.0 07/18/2017 0918                          BMET    Component Value Date/Time   NA 140 07/18/2017 0918   K 4.0 07/18/2017 0918   CL 107 07/18/2017 0918   CO2 24 07/18/2017 0918   GLUCOSE 119 (H) 07/18/2017 0918   BUN 14 07/18/2017 0918   CREATININE 1.18 07/18/2017 0918   CALCIUM 8.9 07/18/2017 0918   GFRNONAA >60 07/18/2017 0918   GFRAA >60 07/18/2017 2010     Assessment/Plan: Patient with progressively worsening neurogenic claudication with severe multifactorial lumbar stenosis at the L4-5 level, contributed to by a grade 1 dynamic degenerative spondylolisthesis of L4 and 5 and large bilateral L4-5 synovial cyst.  Patient admitted now for decompression and stabilization.  I've discussed with the patient the nature of his condition, the nature the surgical procedure, the typical length of surgery, hospital stay, and overall recuperation, the limitations postoperatively, and risks of surgery. I discussed risks including risks of infection, bleeding, possibly need for transfusion, the risk of nerve root dysfunction with pain, weakness, numbness, or paresthesias, the risk of dural tear and CSF leakage and possible need for further surgery, the risk of failure of the arthrodesis and possibly for further surgery, the risk of anesthetic complications including myocardial infarction, stroke, pneumonia, and death. We discussed the need for postoperative immobilization in a lumbar brace. Understanding all this the patient does wish to proceed with surgery and is admitted for such.     Hosie Spangle, MD 07/25/2017 7:16 AM

## 2017-07-25 NOTE — Progress Notes (Signed)
Vitals:   07/25/17 1304 07/25/17 1319 07/25/17 1358 07/25/17 1730  BP: (!) 143/94 135/88 (!) 144/89 (!) 144/88  Pulse: 76 64 72 89  Resp: 16 10 16 18   Temp:  (!) 97.5 F (36.4 C) 98.1 F (36.7 C) 98.9 F (37.2 C)  TempSrc:   Oral Oral  SpO2: 96% 98% 94% 98%  Weight:      Height:        Patient up and ambulating in the halls.  Foley DC'd, no void yet, nursing staff monitoring voiding function.  Dressing clean and dry.  Patient notes no pain in lower extremities.  Plan: Encouraged to continue ambulation in the halls.  Continue to progress through postoperative recovery.  Hosie Spangle, MD 07/25/2017, 5:51 PM

## 2017-07-25 NOTE — Op Note (Signed)
07/25/2017  11:57 AM  PATIENT:  Barry Roberson  57 y.o. male  PRE-OPERATIVE DIAGNOSIS: Multifactorial L4-5 lumbar stenosis with neurogenic claudication; L4-5 grade 1 dynamic degenerative spondylolisthesis; bilateral L4-5 synovial cysts; lumbar spondylosis; lumbar degenerative disc disease  POST-OPERATIVE DIAGNOSIS:  Multifactorial L4-5 lumbar stenosis with neurogenic claudication; L4-5 grade 1 dynamic degenerative spondylolisthesis; bilateral L4-5 synovial cysts; lumbar spondylosis; lumbar degenerative disc disease  PROCEDURE:  Procedure(s): Bilateral L4-5 lumbar decompression including bilateral L4 and L5 lumbar laminectomy, bilateral L4-5 facetectomy, resection of bilateral L4-5 synovial cysts, bilateral L4 and L5 foraminotomies for decompression of stenotic compression of the exiting L4 and L5 nerve roots, with decompression beyond that required for interbody arthrodesis; bilateral L4-5 posterior lumbar interbody arthrodesis with tritanium interbody implants, locally harvested morselized autograft, Vitoss BA with bone marrow aspirate, and infuse; bilateral L4-5 posterior lateral arthrodesis with nonsegmental radius posterior instrumentation, locally harvested morselized autograft, Vitoss BA with bone marrow aspirate, and infuse  SURGEON: Jovita Gamma, MD  ASSISTANTS: Newman Pies, MD  ANESTHESIA:   general  EBL:  Total I/O In: 2250 [I.V.:2000; IV Piggyback:250] Out: 330 [Urine:130; Blood:200]  BLOOD ADMINISTERED:none  CELL SAVER GIVEN: Cell Saver technician felt that there was insufficient recovered blood to return to the patient  COUNT:  Correct per nursing staff  DICTATION: Patient is brought to the operating room placed under general endotracheal anesthesia. The patient was turned to prone position the lumbar region was prepped with Betadine soap and solution and draped in a sterile fashion. The midline was infiltrated with local anesthesia with epinephrine. A localizing x-ray  was taken and then a midline incision was made carried down through the subcutaneous tissue, bipolar cautery and electrocautery were used to maintain hemostasis. Dissection was carried down to the lumbar fascia. The fascia was incised bilaterally and the paraspinal muscles were dissected with a spinous process and lamina in a subperiosteal fashion. Another x-ray was taken for localization and the L4-5 level was localized. Dissection was then carried out laterally over the facet complex and the transverse processes of L4 and L5 were exposed and decorticated.      Decompression was begun by performing bilateral L4 and L5 laminectomies using double-action rongeurs, the high-speed drill, and Kerrison punches.  Bone from the decompression was saved, cleaned of soft tissue, and morselized using the bone mill.  It was later implanted as locally harvested morselized autograft.  Dissection was carried out laterally including facetectomy and foraminotomies with decompression of the stenotic compression of the spinal canal and exiting L4 and L5 nerve roots bilaterally.  The synovial cysts were carefully dissected from their adherence to the thecal sac, mobilized, and removed, with good decompression of the thecal sac and spinal canal.  Once the decompression stenotic compression of the thecal sac and exiting nerve roots was completed we proceeded with the posterior lumbar interbody arthrodesis. The annulus was incised bilaterally and the disc space entered. A thorough discectomy was performed using pituitary rongeurs and curettes. Once the discectomy was completed we began to prepare the endplate surfaces removing the cartilaginous endplates surface. We then measured the height of the intervertebral disc space. We selected 11 x 28 x 6 Tritanium interbody implants.  The C-arm fluoroscope was then draped and brought in the field and we identified the pedicle entry points bilaterally at the L4 and L5 levels. Each of the 4  pedicles was probed, we aspirated bone marrow aspirate from the vertebral bodies, this was injected over a 10 cc and a 5 cc strip of Vitoss  BA. Then each of the pedicles was examined with the ball probe good bony surfaces were found and no bony cuts were found.  The L4 pedicles were then tapped with a 5.25 mm tap, again examined with the ball probe good threading was found and no bony cuts were found. We then placed 5.75 by 40 millimeter screws bilaterally at the L4 level.  The L5 pedicles were then tapped with a 6.25 mm tap, again exam with a ball probe, good threading was found, with no bony cutouts.  We then placed 6.75 x 40 mm screws bilaterally at the L5 level.  We then packed the Tritanium interbody implants with Vitoss BA with bone marrow aspirate and infuse, and then placed the first implant on the right side, carefully retracting the thecal sac and nerve root medially. We then went back to the left side and packed the midline with locally harvested morselized autograft and infuse.  We then placed a second implant and on the left side again retracting the thecal sac and nerve root medially.   We then packed the lateral gutter over the transverse processes and intertransverse space with locally harvested morselized autograft, Vitoss BA with bone marrow aspirate, and infuse. We then selected 35 mm pre-lordosed rods, they were placed within the screw heads and secured with locking caps once all 4 locking caps were placed final tightening was performed against a counter torque.  The wound had been irrigated multiple times during the procedure with saline solution and bacitracin solution, good hemostasis was established with a combination of bipolar cautery and Gelfoam with thrombin. Once good hemostasis was confirmed we proceeded with closure paraspinal muscles deep fascia and Scarpa's fascia were closed with interrupted undyed 1 Vicryl sutures the subcutaneous and subcuticular closed with interrupted  inverted 2-0 undyed Vicryl sutures the skin edges were approximated with Dermabond.  The wound was dressed with sterile gauze and Hypafix.  Following surgery the patient was turned back to the supine position to be reversed and the anesthetic extubated and transferred to the recovery room for further care.  PLAN OF CARE: Admit to inpatient   PATIENT DISPOSITION:  PACU - hemodynamically stable.   Delay start of Pharmacological VTE agent (>24hrs) due to surgical blood loss or risk of bleeding:  yes

## 2017-07-25 NOTE — Anesthesia Procedure Notes (Signed)
Procedure Name: Intubation Date/Time: 07/25/2017 7:38 AM Performed by: Bryson Corona, CRNA Pre-anesthesia Checklist: Patient identified, Emergency Drugs available, Suction available and Patient being monitored Patient Re-evaluated:Patient Re-evaluated prior to induction Oxygen Delivery Method: Circle System Utilized Preoxygenation: Pre-oxygenation with 100% oxygen Induction Type: IV induction Ventilation: Oral airway inserted - appropriate to patient size Laryngoscope Size: Mac and 4 Grade View: Grade II Tube type: Oral Tube size: 7.0 mm Number of attempts: 1 Airway Equipment and Method: Stylet and Oral airway Placement Confirmation: ETT inserted through vocal cords under direct vision,  positive ETCO2 and breath sounds checked- equal and bilateral Secured at: 22 cm Tube secured with: Tape Dental Injury: Teeth and Oropharynx as per pre-operative assessment

## 2017-07-26 MED ORDER — CYCLOBENZAPRINE HCL 10 MG PO TABS: 5.0000 mg | ORAL_TABLET | Freq: Three times a day (TID) | ORAL | 0 refills | Status: DC | PRN

## 2017-07-26 MED ORDER — CYCLOBENZAPRINE HCL 10 MG PO TABS
10.0000 mg | ORAL_TABLET | Freq: Three times a day (TID) | ORAL | Status: DC | PRN
  Administered 2017-07-26: 10 mg via ORAL
  Filled 2017-07-26: qty 1

## 2017-07-26 MED ORDER — HYDROCODONE-ACETAMINOPHEN 5-325 MG PO TABS: 1.0000 | ORAL_TABLET | ORAL | 0 refills | Status: DC | PRN

## 2017-07-26 MED FILL — Heparin Sodium (Porcine) Inj 1000 Unit/ML: INTRAMUSCULAR | Qty: 30 | Status: AC

## 2017-07-26 MED FILL — Sodium Chloride IV Soln 0.9%: INTRAVENOUS | Qty: 1000 | Status: AC

## 2017-07-26 NOTE — Discharge Summary (Signed)
Physician Discharge Summary  Patient ID: Barry Roberson MRN: [id number] DOB/AGE: [birth year] 57 y.o.  Admit date: 07/25/2017 Discharge date: 07/26/2017  Admission Diagnoses:  Multifactorial L4-5 lumbar stenosis with neurogenic claudication; L4-5 grade 1 dynamic degenerative spondylolisthesis; bilateral L4-5 synovial cysts; lumbar spondylosis; lumbar degenerative disc disease  Discharge Diagnoses:  Multifactorial L4-5 lumbar stenosis with neurogenic claudication; L4-5 grade 1 dynamic degenerative spondylolisthesis; bilateral L4-5 synovial cysts; lumbar spondylosis; lumbar degenerative disc disease  Active Problems:   Lumbar stenosis with neurogenic claudication   Discharged Condition: good  Hospital Course: Patient was admitted, underwent a bilateral L4-5 lumbar decompression including resection of bilateral synovial cysts, and then underwent a bilateral L4-5 PLIF and PLA.  Postoperatively he has done well.  He is up and ambulating actively in the halls.  He is voiding well.  His dressing was removed on the morning of discharge, and the incision is healing nicely.  It is clean and dry; there is no erythema, ecchymosis, swelling, or drainage.  He is being discharged home with instructions regarding wound care and activities.  He is scheduled for follow-up with me in the office in 3 weeks.  Discharge Exam: Blood pressure (!) 156/99, pulse 74, temperature 98.8 F (37.1 C), temperature source Oral, resp. rate 18, height 5\' 9"  (1.753 m), weight 109.9 kg (242 lb 3.2 oz), SpO2 98 %.  Disposition: Discharge disposition: 01-Home or Self Care       Discharge Instructions    Discharge wound care:   Complete by:  As directed    Leave the wound open to air. Shower daily with the wound uncovered. Water and soapy water should run over the incision area. Do not wash directly on the incision for 2 weeks. Remove the glue after 2 weeks.   Driving Restrictions   Complete by:  As directed    No driving for  2 weeks. May ride in the car locally now. May begin to drive locally in 2 weeks.   Other Restrictions   Complete by:  As directed    Walk gradually increasing distances out in the fresh air at least twice a day. Walking additional 6 times inside the house, gradually increasing distances, daily. No bending, lifting, or twisting. Perform activities between shoulder and waist height (that is at counter height when standing or table height when sitting).     Allergies as of 07/26/2017   No Known Allergies     Medication List    TAKE these medications   acetaminophen 325 MG tablet Commonly known as:  TYLENOL Take 325-650 mg by mouth every 4 (four) hours as needed for mild pain or moderate pain.   cyclobenzaprine 10 MG tablet Commonly known as:  FLEXERIL Take 0.5-1 tablets (5-10 mg total) by mouth 3 (three) times daily as needed for muscle spasms.   HYDROcodone-acetaminophen 5-325 MG tablet Commonly known as:  NORCO/VICODIN Take 1-2 tablets by mouth every 4 (four) hours as needed (pain).   ibuprofen 200 MG tablet Commonly known as:  ADVIL,MOTRIN Take 800 mg by mouth every 8 (eight) hours as needed for mild pain or moderate pain.            Discharge Care Instructions  (From admission, onward)        Start     Ordered   07/26/17 0000  Discharge wound care:    Comments:  Leave the wound open to air. Shower daily with the wound uncovered. Water and soapy water should run over the incision area. Do not wash  directly on the incision for 2 weeks. Remove the glue after 2 weeks.   07/26/17 0844       Signed: Hosie Spangle 07/26/2017, 8:45 AM

## 2017-07-26 NOTE — Progress Notes (Signed)
Discharge instruction reviewed with patient. RX given. All questions answered a this time. Awaiting for transport.   Ave Filter, RN

## 2017-08-16 DIAGNOSIS — M48062 Spinal stenosis, lumbar region with neurogenic claudication: Secondary | ICD-10-CM | POA: Diagnosis not present

## 2017-10-18 DIAGNOSIS — Z981 Arthrodesis status: Secondary | ICD-10-CM | POA: Diagnosis not present

## 2017-12-19 DIAGNOSIS — M1712 Unilateral primary osteoarthritis, left knee: Secondary | ICD-10-CM | POA: Diagnosis not present

## 2017-12-29 DIAGNOSIS — M1712 Unilateral primary osteoarthritis, left knee: Secondary | ICD-10-CM | POA: Diagnosis not present

## 2017-12-29 DIAGNOSIS — M25562 Pain in left knee: Secondary | ICD-10-CM | POA: Diagnosis not present

## 2018-01-09 DIAGNOSIS — M1712 Unilateral primary osteoarthritis, left knee: Secondary | ICD-10-CM | POA: Diagnosis not present

## 2018-01-25 HISTORY — PX: BACK SURGERY: SHX140

## 2018-02-02 DIAGNOSIS — M25562 Pain in left knee: Secondary | ICD-10-CM | POA: Diagnosis not present

## 2018-02-02 DIAGNOSIS — M1712 Unilateral primary osteoarthritis, left knee: Secondary | ICD-10-CM | POA: Diagnosis not present

## 2018-02-02 DIAGNOSIS — S83232A Complex tear of medial meniscus, current injury, left knee, initial encounter: Secondary | ICD-10-CM | POA: Diagnosis not present

## 2018-02-17 DIAGNOSIS — M47816 Spondylosis without myelopathy or radiculopathy, lumbar region: Secondary | ICD-10-CM | POA: Diagnosis not present

## 2018-02-17 DIAGNOSIS — M5136 Other intervertebral disc degeneration, lumbar region: Secondary | ICD-10-CM | POA: Diagnosis not present

## 2018-02-17 DIAGNOSIS — Z981 Arthrodesis status: Secondary | ICD-10-CM | POA: Diagnosis not present

## 2018-03-15 DIAGNOSIS — I10 Essential (primary) hypertension: Secondary | ICD-10-CM | POA: Diagnosis not present

## 2018-03-15 DIAGNOSIS — M5136 Other intervertebral disc degeneration, lumbar region: Secondary | ICD-10-CM | POA: Diagnosis not present

## 2018-03-28 ENCOUNTER — Encounter: Payer: Self-pay | Admitting: Gastroenterology

## 2018-03-29 DIAGNOSIS — Z0000 Encounter for general adult medical examination without abnormal findings: Secondary | ICD-10-CM | POA: Diagnosis not present

## 2018-03-29 DIAGNOSIS — Z125 Encounter for screening for malignant neoplasm of prostate: Secondary | ICD-10-CM | POA: Diagnosis not present

## 2018-03-29 DIAGNOSIS — R82998 Other abnormal findings in urine: Secondary | ICD-10-CM | POA: Diagnosis not present

## 2018-04-05 DIAGNOSIS — Z0000 Encounter for general adult medical examination without abnormal findings: Secondary | ICD-10-CM | POA: Diagnosis not present

## 2018-04-05 DIAGNOSIS — F17200 Nicotine dependence, unspecified, uncomplicated: Secondary | ICD-10-CM | POA: Diagnosis not present

## 2018-04-05 DIAGNOSIS — Z6833 Body mass index (BMI) 33.0-33.9, adult: Secondary | ICD-10-CM | POA: Diagnosis not present

## 2018-04-05 DIAGNOSIS — I10 Essential (primary) hypertension: Secondary | ICD-10-CM | POA: Diagnosis not present

## 2018-04-10 ENCOUNTER — Other Ambulatory Visit: Payer: Self-pay | Admitting: Internal Medicine

## 2018-04-10 DIAGNOSIS — Z0000 Encounter for general adult medical examination without abnormal findings: Secondary | ICD-10-CM

## 2018-04-10 DIAGNOSIS — Z720 Tobacco use: Secondary | ICD-10-CM

## 2018-04-12 ENCOUNTER — Telehealth: Payer: Self-pay | Admitting: *Deleted

## 2018-04-12 NOTE — Telephone Encounter (Signed)
Pt called PV room 50- I was on phone- LM- I returned call, no answer- [facility] again. Barry Roberson

## 2018-04-12 NOTE — Telephone Encounter (Signed)
LM to [facility] for covid  screen  Lelan Pons PV

## 2018-04-12 NOTE — Telephone Encounter (Signed)
Covid-19 travel screening questions  Have you traveled in the last 14 days? No  If yes where?  Do you now or have you had a fever in the last 14 days? No   Do you have any respiratory symptoms of shortness of breath or cough now or in the last 14 days? no  Do you have a medical history of Congestive Heart Failure? No   Do you have a medical history of lung disease? No   Do you have any family members or close contacts with diagnosed or suspected Covid-19? No

## 2018-04-13 ENCOUNTER — Ambulatory Visit (AMBULATORY_SURGERY_CENTER): Payer: Self-pay | Admitting: *Deleted

## 2018-04-13 ENCOUNTER — Other Ambulatory Visit: Payer: Self-pay

## 2018-04-13 ENCOUNTER — Encounter: Payer: Self-pay | Admitting: Gastroenterology

## 2018-04-13 VITALS — Temp 98.2°F | Ht 69.0 in | Wt 226.0 lb

## 2018-04-13 DIAGNOSIS — Z1211 Encounter for screening for malignant neoplasm of colon: Secondary | ICD-10-CM

## 2018-04-13 MED ORDER — NA SULFATE-K SULFATE-MG SULF 17.5-3.13-1.6 GM/177ML PO SOLN: 1.0000 | Freq: Once | ORAL | 0 refills | Status: AC

## 2018-04-13 NOTE — Progress Notes (Signed)
No egg or soy allergy known to patient   No issues with past sedation with any surgeries  or procedures, no intubation problems  No diet pills per patient No home 02 use per patient  No blood thinners per patient  Pt denies issues with constipation   No A fib or A flutter  EMMI video sent to pt's e mail  Online Sup[employer] $15 coupon

## 2018-04-18 DIAGNOSIS — Z1212 Encounter for screening for malignant neoplasm of rectum: Secondary | ICD-10-CM | POA: Diagnosis not present

## 2018-04-20 ENCOUNTER — Telehealth: Payer: Self-pay | Admitting: *Deleted

## 2018-04-20 NOTE — Telephone Encounter (Signed)
Cancelled 05/25/18 procedure due to Covid 19. Notified we would call back to reschedule as soon as possible. Pt appreciated phone call and voices understanding.

## 2018-04-25 ENCOUNTER — Ambulatory Visit: Payer: 59

## 2018-04-28 ENCOUNTER — Encounter: Payer: 59 | Admitting: Gastroenterology

## 2018-06-06 ENCOUNTER — Telehealth: Payer: Self-pay | Admitting: *Deleted

## 2018-06-06 NOTE — Telephone Encounter (Signed)
Called patient again, no answer, left message for him to call us back.

## 2018-06-06 NOTE — Telephone Encounter (Signed)
Called pt, no answer. Left message for pt to call us back to reschedule his colonoscopy with Dr.Armbruster.

## 2018-07-18 ENCOUNTER — Encounter: Payer: Self-pay | Admitting: Gastroenterology

## 2019-05-26 ENCOUNTER — Other Ambulatory Visit: Payer: Self-pay

## 2019-05-26 ENCOUNTER — Encounter (HOSPITAL_COMMUNITY): Payer: Self-pay

## 2019-05-26 ENCOUNTER — Emergency Department (HOSPITAL_COMMUNITY): Payer: 59

## 2019-05-26 ENCOUNTER — Emergency Department (HOSPITAL_COMMUNITY)
Admission: EM | Admit: 2019-05-26 | Discharge: 2019-05-26 | Disposition: A | Discharge status: Home / Self Care | Payer: 59 | Attending: Emergency Medicine | Admitting: Emergency Medicine

## 2019-05-26 DIAGNOSIS — Y999 Unspecified external cause status: Secondary | ICD-10-CM | POA: Diagnosis not present

## 2019-05-26 DIAGNOSIS — S60352A Superficial foreign body of left thumb, initial encounter: Secondary | ICD-10-CM | POA: Diagnosis present

## 2019-05-26 DIAGNOSIS — Y929 Unspecified place or not applicable: Secondary | ICD-10-CM | POA: Diagnosis not present

## 2019-05-26 DIAGNOSIS — W268XXA Contact with other sharp object(s), not elsewhere classified, initial encounter: Secondary | ICD-10-CM | POA: Insufficient documentation

## 2019-05-26 DIAGNOSIS — Z23 Encounter for immunization: Secondary | ICD-10-CM | POA: Diagnosis not present

## 2019-05-26 DIAGNOSIS — Y9389 Activity, other specified: Secondary | ICD-10-CM | POA: Insufficient documentation

## 2019-05-26 DIAGNOSIS — S6992XA Unspecified injury of left wrist, hand and finger(s), initial encounter: Secondary | ICD-10-CM

## 2019-05-26 MED ORDER — LIDOCAINE HCL 2 % IJ SOLN
INTRAMUSCULAR | Status: AC
  Administered 2019-05-26: 400 mg
  Filled 2019-05-26: qty 20

## 2019-05-26 MED ORDER — TETANUS-DIPHTH-ACELL PERTUSSIS 5-2.5-18.5 LF-MCG/0.5 IM SUSP
0.5000 mL | Freq: Once | INTRAMUSCULAR | Status: AC
  Administered 2019-05-26: 0.5 mL via INTRAMUSCULAR
  Filled 2019-05-26: qty 0.5

## 2019-05-26 MED ORDER — CEPHALEXIN 500 MG PO CAPS
500.0000 mg | ORAL_CAPSULE | Freq: Two times a day (BID) | ORAL | 0 refills | Status: AC
Start: 2019-05-26 — End: 2019-06-02

## 2019-05-26 NOTE — ED Triage Notes (Addendum)
Pt presents with a fish hook in his L thumb. Happened about 30 mins ago. Needs a tetanus.

## 2019-05-26 NOTE — Discharge Instructions (Addendum)
Please return if any worsening signs of infection.  Continue to keep your hands clean and dry

## 2019-05-26 NOTE — ED Provider Notes (Signed)
[facility] DEPT Provider Note   CSN: [id number] Arrival date & time: 05/26/19  2009     History Chief Complaint  Patient presents with  . Foreign Body    L thumb    DEMONI HAENEL is a 59 y.o. male.  The history is provided by the patient.  Foreign Body Intake: left thumb. Suspected object: fish hook. Pain quality:  Aching Pain severity:  Mild Timing:  Constant Progression:  Unchanged Chronicity:  New Worsened by:  Nothing Ineffective treatments:  None tried      Past Medical History:  Diagnosis Date  . Allergy   . Anxiety   . Arthritis    oa  . Dyspnea   . GERD (gastroesophageal reflux disease)    occ  . History of bronchitis    10 years ago   . Hypertension     Patient Active Problem List   Diagnosis Date Noted  . Lumbar stenosis with neurogenic claudication 07/25/2017  . Obese 10/29/2015  . S/P right THA, AA 10/28/2015  . Status post total replacement of left hip 09/16/2015    Past Surgical History:  Procedure Laterality Date  . BACK SURGERY     08/2008 secondary to ruptured L3   . JOINT REPLACEMENT    . LUMBAR FUSION    . TONSILLECTOMY  age 52 or 23  . TOTAL HIP ARTHROPLASTY Left 09/16/2015   Procedure: LEFT TOTAL HIP ARTHROPLASTY ANTERIOR APPROACH;  Surgeon: Paralee Cancel, MD;  Location: [facility] ORS;  Service: Orthopedics;  Laterality: Left;  . TOTAL HIP ARTHROPLASTY Right 10/28/2015   Procedure: RIGHT TOTAL HIP ARTHROPLASTY ANTERIOR APPROACH;  Surgeon: Paralee Cancel, MD;  Location: [facility] OR[facility];  Service: Orthopedics;  Laterality: Right;       Family History  Problem Relation Age of Onset  . Colon polyps Father   . Colon cancer Neg Hx   . Esophageal cancer Neg Hx   . Rectal cancer Neg Hx   . Stomach cancer Neg Hx     Social History   Tobacco Use  . Smoking status: Former Smoker    Packs/day: 0.50    Years: 30.00    Pack years: 15.00    Types: Cigarettes    Quit date: 07/18/2016    Years since quitting: 2.8  .  Smokeless tobacco: Never Used  Substance Use Topics  . Alcohol use: No  . Drug use: No    Home Medications Prior to Admission medications   Medication Sig Start Date End Date Taking? Authorizing Provider  acetaminophen (TYLENOL) 325 MG tablet Take 325-650 mg by mouth every 4 (four) hours as needed for mild pain or moderate pain.    [provider]  cephALEXin (KEFLEX) 500 MG capsule Take 1 capsule (500 mg total) by mouth 2 (two) times daily for 7 days. 05/26/19 06/02/19  Makynleigh Breslin, DO  cyclobenzaprine (FLEXERIL) 10 MG tablet Take 0.5-1 tablets (5-10 mg total) by mouth 3 (three) times daily as needed for muscle spasms. 07/26/17   Jovita Gamma, MD  fluticasone Asencion Islam) 50 MCG/ACT nasal spray  04/05/18   [provider]  ibuprofen (ADVIL,MOTRIN) 200 MG tablet Take 800 mg by mouth every 8 (eight) hours as needed for mild pain or moderate pain.    [provider]  lisinopril (PRINIVIL,ZESTRIL) 20 MG tablet  04/11/18   [provider]  valACYclovir (VALTREX) 1000 MG tablet TAKE 2 TABLET BY MOUTH TWICE A DAY X 24 HOURS DURING OUTBREAK OF COLD SORE 03/15/18  [provider]    Allergies    Patient has no known allergies.  Review of Systems   Review of Systems  Musculoskeletal: Negative for arthralgias and back pain.  Skin: Positive for wound. Negative for color change, pallor and rash.  Neurological: Negative for weakness and numbness.    Physical Exam Updated Vital Signs BP (!) 154/104 (BP Location: Left Arm)   Pulse 75   Temp 98.2 F (36.8 C) (Oral)   Resp 18   SpO2 96%   Physical Exam Constitutional:      General: He is not in acute distress.    Appearance: He is not ill-appearing.  Cardiovascular:     Pulses: Normal pulses.  Musculoskeletal:        General: No tenderness.  Skin:    Capillary Refill: Capillary refill takes less than 2 seconds.     Findings: No bruising, lesion or rash.     Comments: Fishhook at the tip of the  left thumb just proximal to the nail  Neurological:     Mental Status: He is alert.     ED Results / Procedures / Treatments   Labs (all labs ordered are listed, but only abnormal results are displayed) Labs Reviewed - No data to display  EKG None  Radiology No results found.  Procedures .Nerve Block  Date/Time: 05/26/2019 8:53 PM Performed by: Lennice Sites, DO Authorized by: Lennice Sites, DO   Consent:    Consent obtained:  Verbal   Consent given by:  Patient   Risks discussed:  Allergic reaction, bleeding, infection, intravenous injection, nerve damage, pain, swelling and unsuccessful block   Alternatives discussed:  No treatment Indications:    Indications:  Pain relief Location:    Body area:  Upper extremity   Laterality:  Left Pre-procedure details:    Skin preparation:  2% chlorhexidine   Preparation: Patient was prepped and draped in usual sterile fashion   Skin anesthesia (see MAR for exact dosages):    Skin anesthesia method:  Local infiltration   Local anesthetic:  Lidocaine 1% w/o epi Procedure details (see MAR for exact dosages):    Injection procedure:  Anatomic landmarks identified, anatomic landmarks palpated, incremental injection, introduced needle and negative aspiration for blood   Paresthesia:  None Post-procedure details:    Dressing:  None   Outcome:  Anesthesia achieved   Patient tolerance of procedure:  Tolerated well, no immediate complications   (including critical care time)  Medications Ordered in ED Medications  lidocaine (XYLOCAINE) 2 % (with pres) injection (has no administration in time range)  Tdap (BOOSTRIX) injection 0.5 mL (has no administration in time range)    ED Course  I have reviewed the triage vital signs and the nursing notes.  Pertinent labs & imaging results that were available during my care of the patient were reviewed by me and considered in my medical decision making (see chart for details).    MDM  Rules/Calculators/A&P                      JIBRAN LEHL is a 59 year old male who presents to the ED with fishhook in his left thumb.  Patient with normal vitals.  No fever.  Fishhook in the tip of the left thumb.  Overall superficial.  Patient due to pain was unable to use his wire cutters to advance fishhook on his own.  I perform nerve block and due to patient preference and difficulty with positioning, he was  able to use wire cutters to remove the fishhook.  Patient had tetanus shot updated.  Given prophylactic Keflex.  Discharged in good condition.  This chart was dictated using voice recognition software.  Despite best efforts to proofread,  errors can occur which can change the documentation meaning.    Final Clinical Impression(s) / ED Diagnoses Final diagnoses:  Fish hook injury of finger of left hand, initial encounter    Rx / DC Orders ED Discharge Orders         Ordered    cephALEXin (KEFLEX) 500 MG capsule  2 times daily     05/26/19 2057           Lennice Sites, DO 05/26/19 2057

## 2019-06-08 ENCOUNTER — Other Ambulatory Visit: Payer: Self-pay | Admitting: Internal Medicine

## 2019-06-08 DIAGNOSIS — Z0000 Encounter for general adult medical examination without abnormal findings: Secondary | ICD-10-CM

## 2019-07-27 ENCOUNTER — Ambulatory Visit
Admission: RE | Admit: 2019-07-27 | Discharge: 2019-07-27 | Disposition: A | Discharge status: Home / Self Care | Payer: 59 | Source: Ambulatory Visit | Attending: Internal Medicine | Admitting: Internal Medicine

## 2019-07-27 ENCOUNTER — Other Ambulatory Visit: Payer: Self-pay

## 2019-07-27 DIAGNOSIS — Z0000 Encounter for general adult medical examination without abnormal findings: Secondary | ICD-10-CM

## 2020-03-16 IMAGING — MR MR LUMBAR SPINE WO/W CM
4 of 7 series · 26 of 48 positions shown · IV contrast (multihance)
Comparison: 05/30/2017 plain films.  MRI lumbar spine 07/24/2008.

CLINICAL DATA: Low back pain radiating to BILATERAL posterior
thighs, numbness in RIGHT thigh, symptoms started 1 month ago.

EXAM:
MRI LUMBAR SPINE WITHOUT AND WITH CONTRAST
TECHNIQUE: Multiplanar and multiecho pulse sequences of the lumbar spine were
obtained without and with intravenous contrast.
CONTRAST:  20mL MULTIHANCE GADOBENATE DIMEGLUMINE 529 MG/ML IV SOLN

[Series 4: T1 · sagittal · 4.0mm · 0.55mm/px · 4 of 13 slices shown (1 of 2)]
[im 1/13]
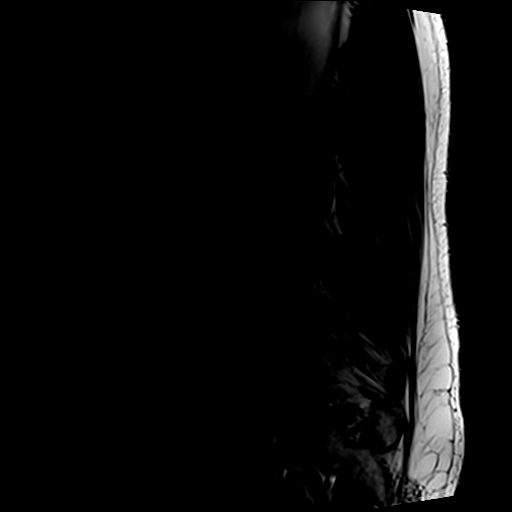
[im 5/13]
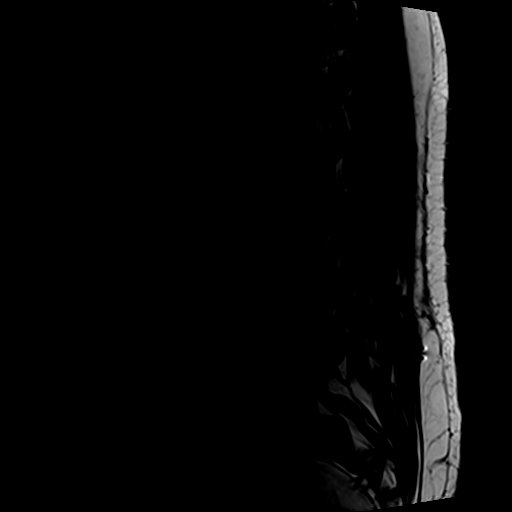
[im 9/13]
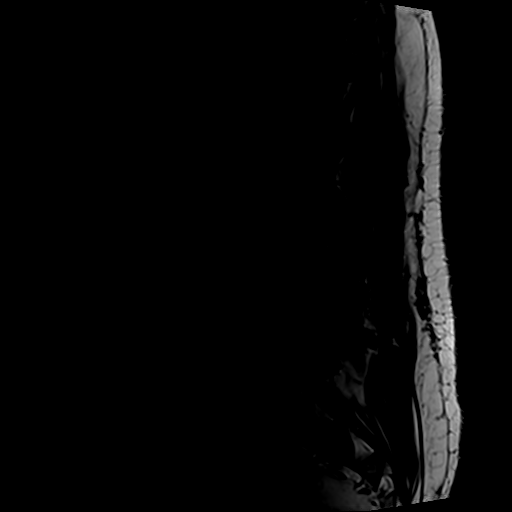
[im 13/13]
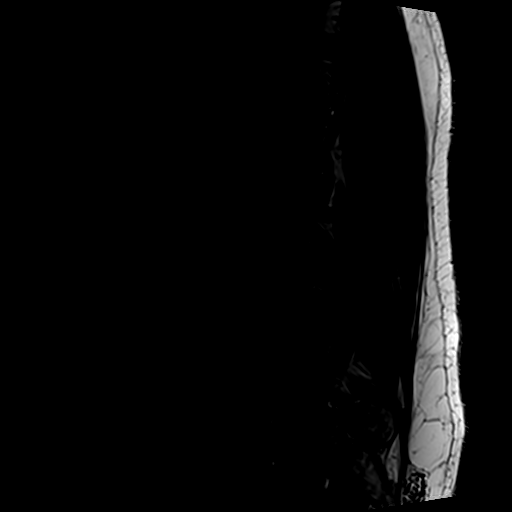

[Series 5: T2 · axial · 4.0mm · 0.78mm/px · z∈[-108,+116]mm · 11 of 40 slices shown (1 of 2)]
[im 1/40]
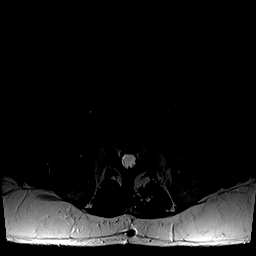
[im 4/40]
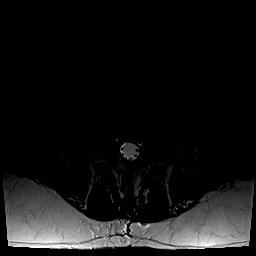
[im 8/40]
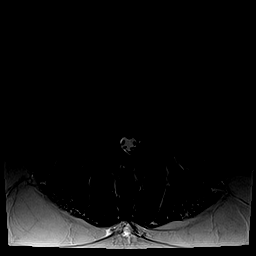
[im 12/40]
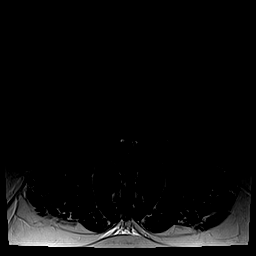
[im 16/40]
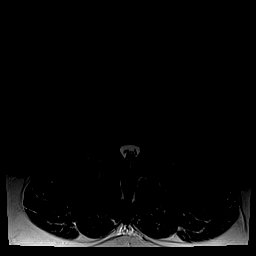
[im 20/40]
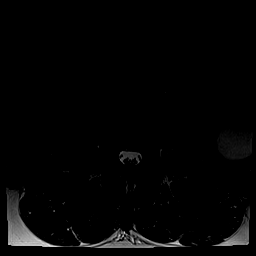
[im 24/40]
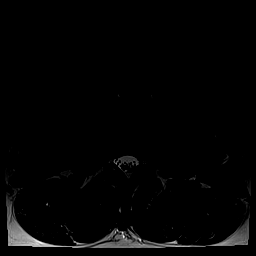
[im 28/40]
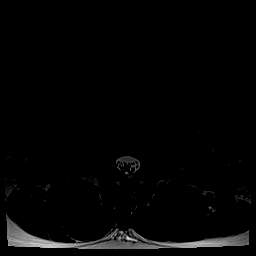
[im 32/40]
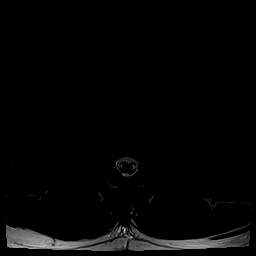
[im 36/40]
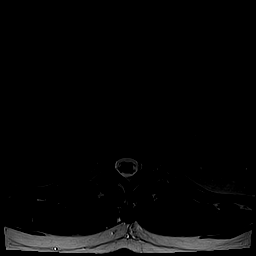
[im 40/40]
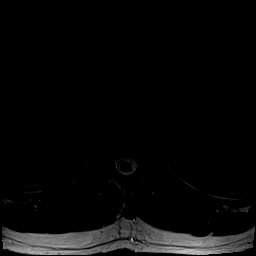

[Series 6: T1 · axial · 4.0mm · 0.39mm/px · z∈[-108,+95]mm · 7 of 40 slices shown (2 of 2)]
[im 1/40]
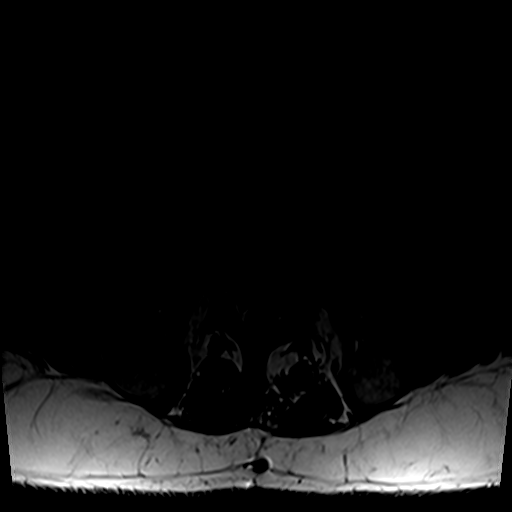
[im 4/40]
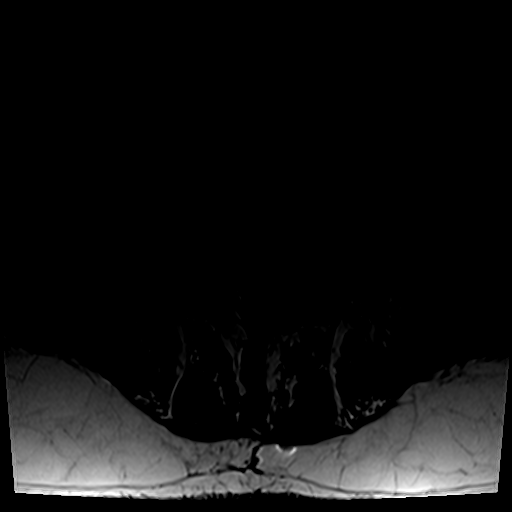
[im 8/40]
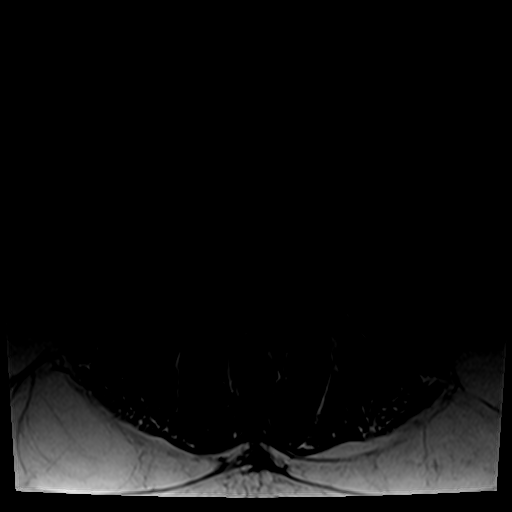
[im 12/40]
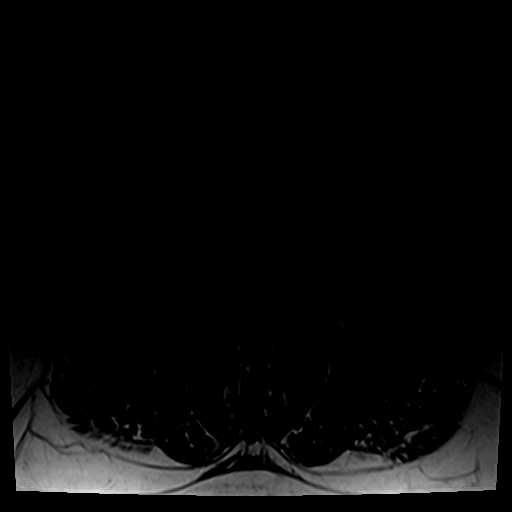
[im 16/40]
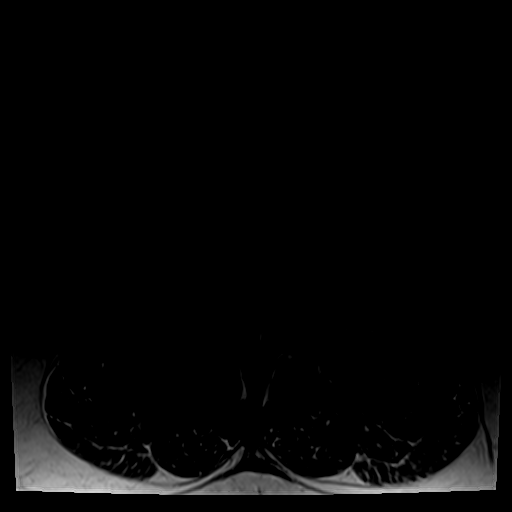
[im 20/40]
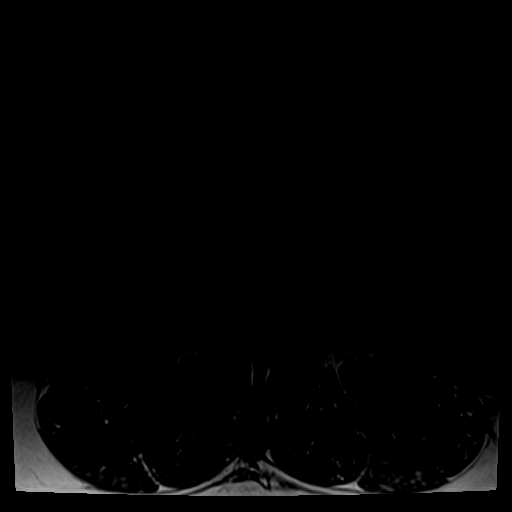
[im 36/40]
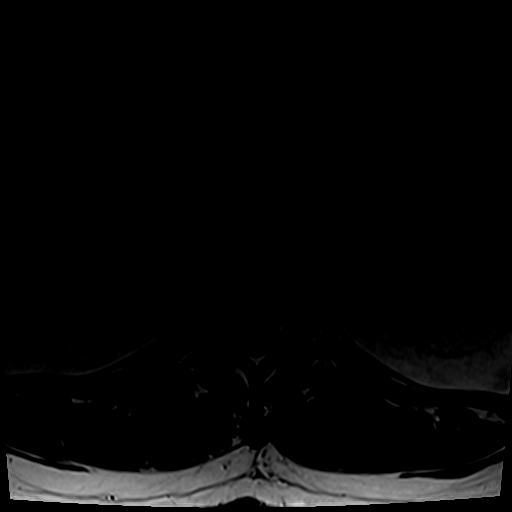

[Series 7: T2 · sagittal · 4.0mm · 0.55mm/px · 4 of 13 slices shown (2 of 2)]
[im 1/13]
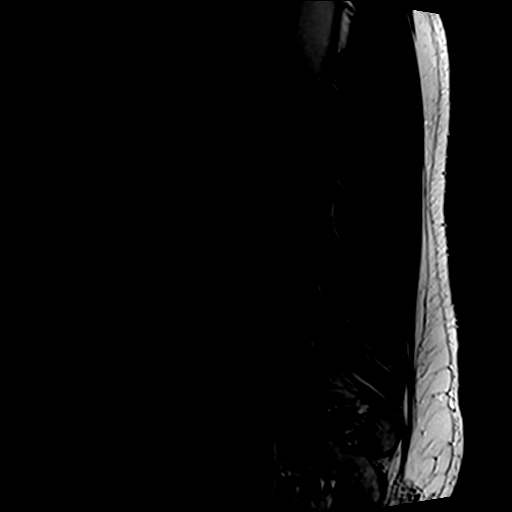
[im 5/13]
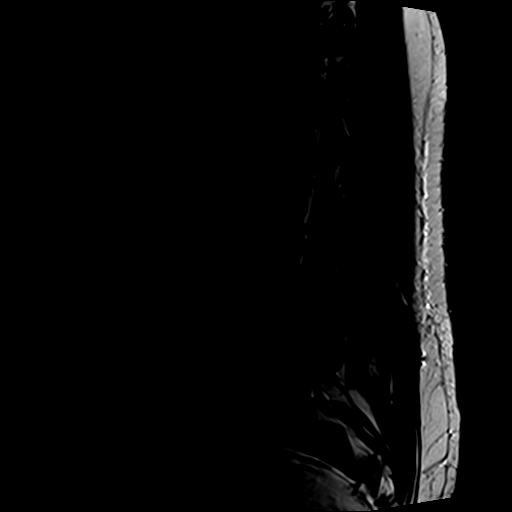
[im 9/13]
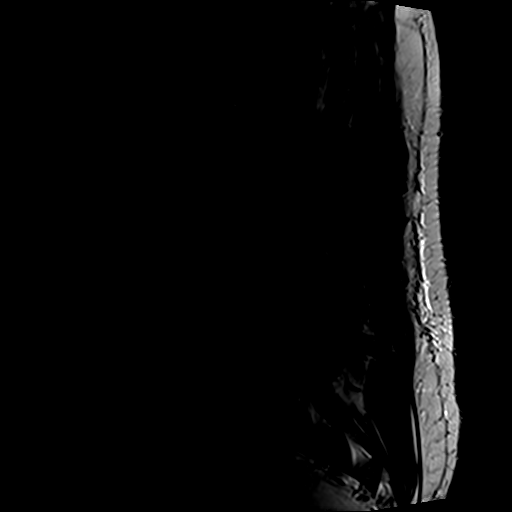
[im 13/13]
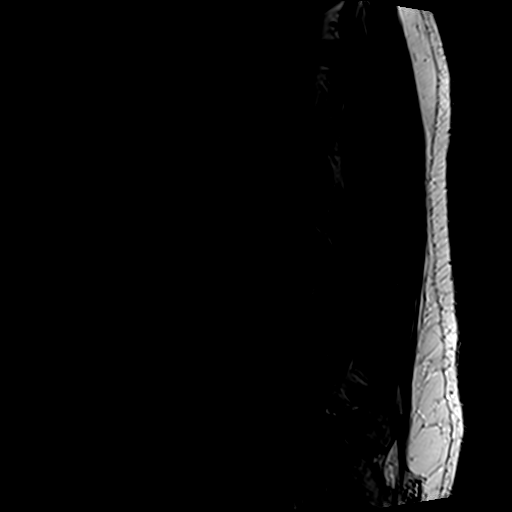

[26 of 48 positions shown; findings below may reference images not displayed]

FINDINGS: Segmentation:  Standard.

Alignment:  Anatomic.

Vertebrae:  No worrisome osseous lesion.

Conus medullaris and cauda equina: Conus extends to the L1 level.
Conus and cauda equina appear normal.

Paraspinal and other soft tissues: Unremarkable. Renal cystic
disease, incompletely evaluated.

Disc levels:

L1-L2:  Normal.

L2-L3:  Normal.

L3-L4:  Normal disc space.  Mild facet arthropathy.  No impingement.

L4-L5: Annular bulge/central annular rent with disc material
extending to both foramina. Facet arthropathy. Ligamentum flavum
hypertrophy, with BILATERAL synovial cysts. Severe stenosis at this
level. The RIGHT cyst measures 6 x 9 mm. The LEFT cyst estimated
measurements of 8 x 13 mm. Both synovial cysts enhance, more
pronounced on the LEFT. BILATERAL L4 and L5 nerve root impingement.

L5-S1: Disc space narrowing status post discectomy. LEFT laminotomy.
Vascularized annular rent. No recurrent protrusion. Facet
arthropathy.

Compared with priors, the findings at L4-5 represent a significant
interval change.
IMPRESSION: The dominant abnormality is at L4-5, where severe stenosis is due to
BILATERAL synovial cysts along with an annular bulge/central annular
rent. BILATERAL L4 and L5 neural impingement. See discussion above.

Postsurgical change L5-S1, without recurrent protrusion.

## 2021-05-27 ENCOUNTER — Encounter (HOSPITAL_COMMUNITY): Payer: Self-pay

## 2021-05-27 ENCOUNTER — Other Ambulatory Visit: Payer: Self-pay

## 2021-05-27 ENCOUNTER — Emergency Department (HOSPITAL_COMMUNITY): Payer: 59

## 2021-05-27 ENCOUNTER — Emergency Department (HOSPITAL_COMMUNITY)
Admission: EM | Admit: 2021-05-27 | Discharge: 2021-05-27 | Disposition: A | Discharge status: Home / Self Care | Payer: 59 | Attending: Emergency Medicine | Admitting: Emergency Medicine

## 2021-05-27 DIAGNOSIS — R110 Nausea: Secondary | ICD-10-CM | POA: Insufficient documentation

## 2021-05-27 DIAGNOSIS — I10 Essential (primary) hypertension: Secondary | ICD-10-CM | POA: Insufficient documentation

## 2021-05-27 DIAGNOSIS — Z79899 Other long term (current) drug therapy: Secondary | ICD-10-CM | POA: Diagnosis not present

## 2021-05-27 DIAGNOSIS — R42 Dizziness and giddiness: Secondary | ICD-10-CM | POA: Insufficient documentation

## 2021-05-27 LAB — CBC WITH DIFFERENTIAL/PLATELET
Abs Immature Granulocytes: 0.05 10*3/uL (ref 0.00–0.07)
Basophils Absolute: 0.1 10*3/uL (ref 0.0–0.1)
Basophils Relative: 1 %
Eosinophils Absolute: 0.3 10*3/uL (ref 0.0–0.5)
Eosinophils Relative: 3 %
HCT: 43.5 % (ref 39.0–52.0)
Hemoglobin: 14.6 g/dL (ref 13.0–17.0)
Immature Granulocytes: 1 %
Lymphocytes Relative: 31 %
Lymphs Abs: 3 10*3/uL (ref 0.7–4.0)
MCH: 32.5 pg (ref 26.0–34.0)
MCHC: 33.6 g/dL (ref 30.0–36.0)
MCV: 96.9 fL (ref 80.0–100.0)
Monocytes Absolute: 1 10*3/uL (ref 0.1–1.0)
Monocytes Relative: 10 %
Neutro Abs: 5.3 10*3/uL (ref 1.7–7.7)
Neutrophils Relative %: 54 %
Platelets: 191 10*3/uL (ref 150–400)
RBC: 4.49 MIL/uL (ref 4.22–5.81)
RDW: 13.1 % (ref 11.5–15.5)
WBC: 9.6 10*3/uL (ref 4.0–10.5)
nRBC: 0 % (ref 0.0–0.2)

## 2021-05-27 LAB — COMPREHENSIVE METABOLIC PANEL
ALT: 25 U/L (ref 0–44)
AST: 18 U/L (ref 15–41)
Albumin: 3.7 g/dL (ref 3.5–5.0)
Alkaline Phosphatase: 53 U/L (ref 38–126)
Anion gap: 5 (ref 5–15)
BUN: 25 mg/dL — ABNORMAL HIGH (ref 6–20)
CO2: 24 mmol/L (ref 22–32)
Calcium: 9.2 mg/dL (ref 8.9–10.3)
Chloride: 111 mmol/L (ref 98–111)
Creatinine, Ser: 1.08 mg/dL (ref 0.61–1.24)
GFR, Estimated: >60 mL/min (ref >60)
Glucose, Bld: 105 mg/dL — ABNORMAL HIGH (ref 70–99)
Potassium: 4.3 mmol/L (ref 3.5–5.1)
Sodium: 140 mmol/L (ref 135–145)
Total Bilirubin: 0.7 mg/dL (ref 0.3–1.2)
Total Protein: 6.5 g/dL (ref 6.5–8.1)

## 2021-05-27 MED ORDER — MECLIZINE HCL 25 MG PO TABS
25.0000 mg | ORAL_TABLET | Freq: Once | ORAL | Status: AC
  Administered 2021-05-27: 25 mg via ORAL
  Filled 2021-05-27: qty 1

## 2021-05-27 MED ORDER — MECLIZINE HCL 25 MG PO TABS: 25.0000 mg | ORAL_TABLET | Freq: Three times a day (TID) | ORAL | 0 refills | Status: DC | PRN

## 2021-05-27 NOTE — ED Triage Notes (Signed)
Pt complains of HTN, nausea, and dizziness since Sunday. Pt reports having some major stressors in his life right now.  ?

## 2021-05-28 NOTE — ED Provider Notes (Signed)
?[facility] DEPT ?Provider Note ? ? ?CSN: [id number] ?Arrival date & time: 05/27/21  0126 ? ?  ? ?History ? ?Chief Complaint  ?Patient presents with  ? Hypertension  ? Nausea  ? Dizziness  ? ? ?Barry Roberson is a 61 y.o. male. ? ?61 year old male who presents the ER today secondary to dizziness.  Patient states that a few times over the last 48 hours has had episodes of feeling like he is drunk.  He does not drink alcohol anymore but has felt like the world is tilted and swimming.  He will have some nausea with that the most recent time.  He checked his blood pressure during these episodes and its been elevated as high as 970 systolic.  He does have a history of hypertension but not usually that high when he is on his medication.  He does not sleep much and has had a lot of stress over the last 6 months with specifically a few weeks ago when his father died.  No specific headache, chest pain back pain abdominal pain or other associated symptoms.  No history of vertigo.  States he does have a whooshing sound in his left ear but no fullness or fever or significant pain there. ? ? ?Hypertension ? ?Dizziness ? ?  ? ?Home Medications ?Prior to Admission medications   ?Medication Sig Start Date End Date Taking? Authorizing Provider  ?ibuprofen (ADVIL,MOTRIN) 200 MG tablet Take 800 mg by mouth every 8 (eight) hours as needed for mild pain or moderate pain.   Yes [provider]  ?losartan (COZAAR) 50 MG tablet Take 50 mg by mouth at bedtime. 04/27/21  Yes [provider]  ?meclizine (ANTIVERT) 25 MG tablet Take 1 tablet (25 mg total) by mouth 3 (three) times daily as needed for dizziness. 05/27/21  Yes Javen Hinderliter, Corene Cornea, MD  ?   ? ?Allergies    ?Patient has no known allergies.   ? ?Review of Systems   ?Review of Systems  ?Neurological:  Positive for dizziness.  ? ?Physical Exam ?Updated Vital Signs ?BP (!) 154/95   Pulse 64   Temp 98.8 ?F (37.1 ?C) (Oral)   Resp 16   Ht '5\' 9"'$   (1.753 m)   Wt 102.1 kg   SpO2 96%   BMI 33.23 kg/m?  ?Physical Exam ?Vitals and nursing note reviewed.  ?Constitutional:   ?   Appearance: He is well-developed.  ?HENT:  ?   Head: Normocephalic and atraumatic.  ?   Mouth/Throat:  ?   Mouth: Mucous membranes are moist.  ?   Pharynx: Oropharynx is clear.  ?Eyes:  ?   Pupils: Pupils are equal, round, and reactive to light.  ?Cardiovascular:  ?   Rate and Rhythm: Normal rate.  ?Pulmonary:  ?   Effort: Pulmonary effort is normal. No respiratory distress.  ?Abdominal:  ?   General: Abdomen is flat. There is no distension.  ?Musculoskeletal:     ?   General: Normal range of motion.  ?   Cervical back: Normal range of motion.  ?Skin: ?   General: Skin is warm and dry.  ?Neurological:  ?   General: No focal deficit present.  ?   Mental Status: He is alert and oriented to person, place, and time.  ?   Cranial Nerves: No cranial nerve deficit.  ?   Sensory: No sensory deficit.  ?   Motor: No weakness.  ?   Coordination: Coordination normal.  ?  Gait: Gait normal.  ? ? ?ED Results / Procedures / Treatments   ?Labs ?(all labs ordered are listed, but only abnormal results are displayed) ?Labs Reviewed  ?COMPREHENSIVE METABOLIC PANEL - Abnormal; Notable for the following components:  ?    Result Value  ? Glucose, Bld 105 (*)   ? BUN 25 (*)   ? All other components within normal limits  ?CBC WITH DIFFERENTIAL/PLATELET  ? ? ?EKG ?None ? ?Radiology ?CT Head Wo Contrast ? ?Result Date: 05/27/2021 ?CLINICAL DATA:  Neuro deficit with acute stroke suspected. Hypertension and nausea for 4 days. EXAM: CT HEAD WITHOUT CONTRAST TECHNIQUE: Contiguous axial images were obtained from the base of the skull through the vertex without intravenous contrast. RADIATION DOSE REDUCTION: This exam was performed according to the departmental dose-optimization program which includes automated exposure control, adjustment of the mA and/or kV according to patient size and/or use of iterative  reconstruction technique. COMPARISON:  None Available. FINDINGS: Brain: No evidence of acute infarction, hemorrhage, hydrocephalus, extra-axial collection or mass lesion/mass effect. Vascular: No hyperdense vessel or unexpected calcification. Skull: Normal. Negative for fracture or focal lesion. Sinuses/Orbits: No acute finding. IMPRESSION: Negative head CT. Electronically Signed   By: Jorje Guild M.D.   On: 05/27/2021 04:52   ? ?Procedures ?Procedures  ? ? ?Medications Ordered in ED ?Medications  ?meclizine (ANTIVERT) tablet 25 mg (25 mg Oral Given 05/27/21 0455)  ? ? ?ED Course/ Medical Decision Making/ A&P ?  ?                        ?Medical Decision Making ?Amount and/or Complexity of Data Reviewed ?Labs: ordered. ?Radiology: ordered. ?ECG/medicine tests: ordered. ? ? ?Symptoms seem consistent with vertigo.  Seems to be peripheral in nature.  No evidence of effusion or other abnormalities in his left ear to think it might be vestibular neuritis.  CT okay.  This was checked since he did have high blood pressure to make sure that it was not related to some type of ischemia.  Meclizine helped his symptoms considerably.  Patient neuro exam is intact and stable.  He is stable for discharge with ENT follow-up as needed if symptoms not improving. ? ? ?Final Clinical Impression(s) / ED Diagnoses ?Final diagnoses:  ?Vertigo  ?Hypertension, unspecified type  ? ? ?Rx / DC Orders ?ED Discharge Orders   ? ?      Ordered  ?  meclizine (ANTIVERT) 25 MG tablet  3 times daily PRN       ? 05/27/21 0641  ? ?  ?  ? ?  ? ? ?  ?Merrily Pew, MD ?05/28/21 [phone] ? ?

## 2021-06-08 NOTE — Therapy (Incomplete)
?OUTPATIENT PHYSICAL THERAPY VESTIBULAR EVALUATION ? ? ? ? ?[name] ?MRN: [id number] ?DOB:01-09-1961, 61 y.o., male ?Today's Date: 06/08/2021 ? ?PCP: Velna Hatchet, MD ?REFERRING PROVIDER: Geoffery Lyons, NP  ? ?  ? ?Past Medical History:  ?Diagnosis Date  ? Allergy   ? Anxiety   ? Arthritis   ? oa  ? Dyspnea   ? GERD (gastroesophageal reflux disease)   ? occ  ? History of bronchitis   ? 10 years ago   ? Hypertension   ? ?Past Surgical History:  ?Procedure Laterality Date  ? BACK SURGERY    ? 08/2008 secondary to ruptured L3   ? JOINT REPLACEMENT    ? LUMBAR FUSION    ? TONSILLECTOMY  age 80 or 52  ? TOTAL HIP ARTHROPLASTY Left 09/16/2015  ? Procedure: LEFT TOTAL HIP ARTHROPLASTY ANTERIOR APPROACH;  Surgeon: Paralee Cancel, MD;  Location: [facility];  Service: Orthopedics;  Laterality: Left;  ? TOTAL HIP ARTHROPLASTY Right 10/28/2015  ? Procedure: RIGHT TOTAL HIP ARTHROPLASTY ANTERIOR APPROACH;  Surgeon: Paralee Cancel, MD;  Location: [facility];  Service: Orthopedics;  Laterality: Right;  ? ?Patient Active Problem List  ? Diagnosis Date Noted  ? Lumbar stenosis with neurogenic claudication 07/25/2017  ? Obese 10/29/2015  ? S/P right THA, AA 10/28/2015  ? Status post total replacement of left hip 09/16/2015  ? ? ?ONSET DATE: *** ? ?REFERRING DIAG: Dizziness and giddiness  ? ?THERAPY DIAG:  ?No diagnosis found. ? ?SUBJECTIVE:  ? ?SUBJECTIVE STATEMENT: ?*** ?Pt accompanied by: {accompnied:27141} ? ?PERTINENT HISTORY: anxiety, GERD, HTN, lumbar fusion, B THA 2017 ? ? ?PAIN:  ?Are you having pain? {OPRCPAIN:27236} ? ?PRECAUTIONS: {Therapy precautions:24002} ? ?WEIGHT BEARING RESTRICTIONS No ? ?FALLS: Has patient fallen in last 6 months? {fallsyesno:27318} ? ?LIVING ENVIRONMENT: ?Lives with: {OPRC lives with:25569::"lives with their family"} ?Lives in: {Lives in:25570} ?Stairs: {opstairs:27293} ?Has following equipment at home: {Assistive devices:23999} ? ?PLOF: {PLOF:24004} ? ?PATIENT GOALS *** ? ?OBJECTIVE:   ? ?DIAGNOSTIC FINDINGS:  ?05/27/21 head CT: Negative head CT ? ?COGNITION: ?Overall cognitive status: {cognition:24006} ?  ?SENSATION: ?{sensation:27233} ? ?POSTURE: {posture:25561} ? ? ?GAIT: ?Gait pattern: {gait characteristics:25376} ?Distance walked: *** ?Assistive device utilized: {Assistive devices:23999} ?Level of assistance: {Levels of assistance:24026} ?Comments: *** ? ?FUNCTIONAL TESTs:  ?{Functional tests:24029} ? ? ?VESTIBULAR ASSESSMENT ? ? GENERAL OBSERVATION: *** ?  ? SYMPTOM BEHAVIOR: ?  Subjective history: *** ?  Non-Vestibular symptoms: {nonvestibular symptoms:25260} ?  Type of dizziness: {Type of Dizziness:25255} ?  Frequency: *** ?  Duration: *** ?  Aggravating factors: {Aggravating Factors:25258} ?  Relieving factors: {Relieving Factors:25259} ?  Progression of symptoms: {DESC; BETTER/WORSE:18575} ? ? OCULOMOTOR EXAM: ?  Ocular Alignment: {Ocular Alignment:25262} ?  Ocular ROM: {RANGE OF MOTION:21649} ?  Spontaneous Nystagmus: {Spontaneous nystagmus:25263} ?  Gaze-Induced Nystagmus: {gaze-induced nystagmus:25264} ?  Smooth Pursuits: {smooth pursuit:25265} ?  Saccades: {saccades:25266} ?  Convergence/Divergence: *** cm  ? ? VESTIBULAR - OCULAR REFLEX:  ?  Slow VOR: {slow VOR:25290} ?  VOR Cancellation: {vor cancellation:25291} ?  Head-Impulse Test: {head impulse test:25272} ?  Dynamic Visual Acuity: {dynamic visual acuity:25273} ?  ? POSITIONAL TESTING: {Positional tests:25271} ?  ? ?OTHOSTATICS: {Exam; orthostatics:31331} ? ?FUNCTIONAL GAIT: {Functional tests:24029} ? ? ?VESTIBULAR TREATMENT: ? ?Canalith Repositioning: ?  {Canalith Repositioning:25283} ?Gaze Adaptation: ?  {gaze adaptation:25286} ?Habituation: ?  {habituation:25288} ?Other: *** ? ?PATIENT EDUCATION: ?Education details: *** ?Person educated: {Person educated:25204} ?Education method: {Education Method:25205} ?Education comprehension: {Education Comprehension:25206} ? ? ?GOALS: ?Goals reviewed with patient? Yes ? ?SHORT  TERM  GOALS: Target date: {follow up:25551} ? ?Patient to be independent with initial HEP. ?Baseline: HEP initiated ?Goal status: INITIAL ? ? ? ?LONG TERM GOALS: Target date: {follow up:25551} ? ?Patient to be independent with advanced HEP. ?Baseline: Not yet initiated  ?Goal status: INITIAL ? ?Patient to report 0/10 dizziness with standing vertical and horizontal VOR for 30 seconds. ?Baseline: Unable ?Goal status: INITIAL ? ?Patient will report 0/10 dizziness with bed mobility.  ?Baseline: Symptomatic  ?Goal status: INITIAL ? ?Patient to demonstrate *** sway with M-CTSIB condition with eyes closed/foam surface in order to improve safety in environments with uneven surfaces and dim lighting. ?Baseline: *** ?Goal status: INITIAL ? ?Patient to score at least 20/24 on DGI in order to decrease risk of falls. ?Baseline: *** ?Goal status: INITIAL ? ?Patient will ambulate over outdoor surfaces with LRAD while performing head turns to scan environment with good stability in order to indicate safe community mobility. ?Baseline: Unable ?Goal status: INITIAL ? ? ? ? ?ASSESSMENT: ? ?CLINICAL IMPRESSION: ? ? ?Patient is a 61 y/o M presenting to [facility] with c/o dizziness for the past ***. ? ? ?Denies head trauma, infection/illness, vision changes/double vision, hearing loss, tinnitus, otalgia, photo/phonophobia. ? ?Oculomotor exam revealed ***. ? ?Positional testing was ***. ? ? ?Patient was educated on gentle *** HEP and reported understanding. Would benefit from skilled PT services ***x/week for *** weeks to address aforementioned impairments in order to optimize level of function.   ? ? ?OBJECTIVE IMPAIRMENTS {opptimpairments:25111}.  ? ?ACTIVITY LIMITATIONS {activity limitations:25113}.  ? ?PERSONAL FACTORS {Personal factors:25162} are also affecting patient's functional outcome.  ? ? ?REHAB POTENTIAL: {rehabpotential:25112} ? ?CLINICAL DECISION MAKING: {clinical decision making:25114} ? ?EVALUATION COMPLEXITY: {Evaluation  complexity:25115} ? ? ?PLAN: ?PT FREQUENCY: {rehab frequency:25116} ? ?PT DURATION: {rehab duration:25117} ? ?PLANNED INTERVENTIONS: {rehab planned interventions:25118::"Therapeutic exercises","Therapeutic activity","Neuromuscular re-education","Balance training","Gait training","Patient/Family education","Joint mobilization"} ? ?PLAN FOR NEXT SESSION: *** ? ? ?Manuela Neptune, PT ?06/08/2021, 4:37 PM ? ?

## 2021-06-09 ENCOUNTER — Ambulatory Visit: Payer: 59 | Admitting: Physical Therapy

## 2021-06-12 ENCOUNTER — Other Ambulatory Visit: Payer: Self-pay

## 2021-06-12 ENCOUNTER — Encounter: Payer: Self-pay | Admitting: Physical Therapy

## 2021-06-12 ENCOUNTER — Ambulatory Visit: Payer: 59 | Attending: Family Medicine | Admitting: Physical Therapy

## 2021-06-12 DIAGNOSIS — R42 Dizziness and giddiness: Secondary | ICD-10-CM | POA: Insufficient documentation

## 2021-06-12 DIAGNOSIS — R2681 Unsteadiness on feet: Secondary | ICD-10-CM | POA: Insufficient documentation

## 2021-06-12 NOTE — Therapy (Signed)
OUTPATIENT PHYSICAL THERAPY VESTIBULAR EVALUATION     [name] MRN: [id number] DOB:09/06/1960, 61 y.o., male Today's Date: 06/12/2021  PCP: Velna Hatchet, MD REFERRING PROVIDER: Geoffery Lyons, NP    PT End of Session - 06/12/21 0934     Visit Number 1    Number of Visits 9    Date for PT Re-Evaluation 07/10/21    Authorization Type [employer]    PT Start Time 0934    PT Stop Time 1018    PT Time Calculation (min) 44 min    Activity Tolerance Patient tolerated treatment well    Behavior During Therapy [facility] for tasks assessed/performed             Past Medical History:  Diagnosis Date   Allergy    Anxiety    Arthritis    oa   Dyspnea    GERD (gastroesophageal reflux disease)    occ   History of bronchitis    10 years ago    Hypertension    Past Surgical History:  Procedure Laterality Date   BACK SURGERY     08/2008 secondary to ruptured L3    JOINT REPLACEMENT     LUMBAR FUSION     TONSILLECTOMY  age 22 or 47   TOTAL HIP ARTHROPLASTY Left 09/16/2015   Procedure: LEFT TOTAL HIP ARTHROPLASTY ANTERIOR APPROACH;  Surgeon: Paralee Cancel, MD;  Location: [facility];  Service: Orthopedics;  Laterality: Left;   TOTAL HIP ARTHROPLASTY Right 10/28/2015   Procedure: RIGHT TOTAL HIP ARTHROPLASTY ANTERIOR APPROACH;  Surgeon: Paralee Cancel, MD;  Location: [facility];  Service: Orthopedics;  Laterality: Right;   Patient Active Problem List   Diagnosis Date Noted   Lumbar stenosis with neurogenic claudication 07/25/2017   Obese 10/29/2015   S/P right THA, AA 10/28/2015   Status post total replacement of left hip 09/16/2015    ONSET DATE: 06/05/2021   REFERRING DIAG: R42 (ICD-10-CM) - Dizziness and giddiness   THERAPY DIAG:  Dizziness and giddiness  Unsteadiness on feet  Rationale for Evaluation and Treatment Rehabilitation  SUBJECTIVE:   SUBJECTIVE STATEMENT: Have been under a lot of stress with father's unexpected passing.  Have had several dizzy spells.   Then woke up and had spinning sensation.  Had to go to ED.  Feel better, back to about 99% of normal.  Not taking Meclizine. Pt accompanied by: self  PERTINENT HISTORY: ED visit due to dizziness 05/27/2021; PMH includes THA R and L 2017; lumbar fusion, anxiety, OA, GERD, HTN, reports cervical disc issue    PAIN:  Are you having pain? No  PRECAUTIONS: Fall   FALLS: Has patient fallen in last 6 months? 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, setting up for events; not currently working  PATIENT GOALS Pt's goals for therapy are to get "head back right"  OBJECTIVE:   DIAGNOSTIC FINDINGS: CT scan negative  POSTURE: No Significant postural limitations   Cervical ROM:   Some limitation due to pt reported cervical disc issue Active A/PROM (deg) eval  Flexion   Extension   Right lateral flexion   Left lateral flexion   Right rotation   Left rotation   (Blank rows = not tested)  STRENGTH: NT  LOWER EXTREMITY MMT:   MMT Right eval Left eval  Hip flexion    Hip abduction    Hip adduction    Hip internal rotation    Hip  external rotation    Knee flexion    Knee extension    Ankle dorsiflexion    Ankle plantarflexion    Ankle inversion    Ankle eversion    (Blank rows = not tested)  BED MOBILITY:  Able to perform, slowly due to hx of low back fusion    GAIT: Gait pattern:  slowed, slightly guarded Distance walked: 120 ft  Assistive device utilized: None Level of assistance: Complete Independence Comments: NA  FUNCTIONAL TESTs:  Dynamic Gait Index: 20/24  (scores <19/24 indicate increased fall risk)   VESTIBULAR ASSESSMENT  GENERAL OBSERVATION: Reports some "wooziness" first thing in the morning.  Some guarding of mobility, trying to slow down.    SYMPTOM BEHAVIOR: Subjective history: Sat up in bed on 05/26/21 early am and room was spinning. Crawled to bathroom, as I felt nauseous.  No hx of  migraines,    Non-Vestibular symptoms: nausea/vomiting   Type of dizziness: Spinning/Vertigo and Hears pulse in L ear   Frequency: mostly just in the mornings   Duration: minutes (15-20 minutes) worst episode   Aggravating factors: Worse in the morning, Moving eyes, and turning head   Relieving factors: slow movements   Progression of symptoms: better   OCULOMOTOR EXAM:   Ocular Alignment: normal   Ocular ROM: No Limitations   Spontaneous Nystagmus: absent   Gaze-Induced Nystagmus: absent   Smooth Pursuits: intact   Saccades: intact       VESTIBULAR - OCULAR REFLEX:    Slow VOR: Normal   VOR Cancellation: Normal   Head-Impulse Test: HIT Right: negative HIT Left: negative   Dynamic Visual Acuity: Not able to be assessed    POSITIONAL TESTING: Right Dix-Hallpike: none; Duration:none Left Dix-Hallpike: none; Duration: none Right Roll Test: none; Duration: none Left Roll Test: none; Duration: none      VESTIBULAR TREATMENT:  Canalith Repositioning:   Comment: NA Gaze Adaptation:   x1 Viewing Horizontal: Position: standing feet apart, Reps: 5-10, and Comment: no c/o dizziness and x1 Viewing Vertical:  Position: standing feet apart, Reps: 5 reps, and Comment: no c/o dizziness Habituation:   Other: Short distance gait with head turns added to HEP  PATIENT EDUCATION: Education details: PT eval results, POC Person educated: Patient Education method: Explanation, Demonstration, and Handouts Education comprehension: verbalized understanding and returned demonstration   Access Code: [email or web address] URL: [email or web address] Date: 06/12/2021 Prepared by: [facility]  Exercises - Standing Gaze Stabilization with Head Rotation  - 2-3 x daily - 7 x weekly - 2 sets - 10 reps - Standing Gaze Stabilization with Head Nod  - 2-3 x daily - 7 x weekly - 2 sets - 10 reps - Walking with Head Rotation  - 2-3 x daily - 7 x weekly - 1 sets -  3-5 reps  GOALS: Goals reviewed with patient? Yes  STGs = LTGs  LONG TERM GOALS: Target date: 07/10/2021    Pt will be independent with HEP to decrease dizziness and improve overall functional mobility. Baseline:  Goal status: INITIAL  2.  Pt will report no dizziness with bed mobility or with head motions. Baseline:  Goal status: INITIAL  3.  DGI score to improve to at least 22/24 for improved overall functional dynamic mobility and gait.  Baseline: 20/24 Goal status: INITIAL   ASSESSMENT:  CLINICAL IMPRESSION: Patient is a 61 y.o. male who was seen today for physical therapy evaluation and treatment for vertigo.   He presented to  ED 05/27/2021 following episodes of intense room-spinning type dizziness and nausea/vomiting.  He has not had any additional episodes that severe since that time (no longer taking Meclizine) and he reports being back to "99%".  In PT eval today, oculomotor testing is [facility] and pt does not appear to have nystagmus or positional vertigo symptoms with Dix-Hallpike or Horizontal roll testing.  He seems to be moving at slightly slower speed with gait and DGI score is 20/24.  He currently has not yet returned to work.  He would benefit from skilled PT to address vestibular deficits for return to functional mobility PLOF and independence.   OBJECTIVE IMPAIRMENTS Abnormal gait, decreased mobility, and dizziness.   ACTIVITY LIMITATIONS community activity and occupation.   PERSONAL FACTORS Past/current experiences and 3+ comorbidities: See PMH.  are also affecting patient's functional outcome. Stressors surrounding recent sudden death of father.   REHAB POTENTIAL: Good  CLINICAL DECISION MAKING: Stable/uncomplicated  EVALUATION COMPLEXITY: Low   PLAN: PT FREQUENCY: 2x/week  PT DURATION: 4 weeks  PLANNED INTERVENTIONS: Therapeutic exercises, Therapeutic activity, Neuromuscular re-education, Balance training, Gait training, Patient/Family education, Joint  mobilization, Vestibular training, and Canalith repositioning  PLAN FOR NEXT SESSION: REassess positional vertigo if needed; review HEP and progress exercises as tolerated.     Ryelan Kazee W., PT 06/12/2021, 11:56 AM

## 2021-06-12 NOTE — Patient Instructions (Signed)
Access Code: [email or web address] URL: [email or web address] Date: 06/12/2021 Prepared by: [facility]  Exercises - Standing Gaze Stabilization with Head Rotation  - 2-3 x daily - 7 x weekly - 2 sets - 10 reps - Standing Gaze Stabilization with Head Nod  - 2-3 x daily - 7 x weekly - 2 sets - 10 reps - Walking with Head Rotation  - 2-3 x daily - 7 x weekly - 1 sets - 3-5 reps

## 2021-06-19 ENCOUNTER — Encounter: Payer: Self-pay | Admitting: Physical Therapy

## 2021-06-19 ENCOUNTER — Ambulatory Visit: Payer: 59 | Admitting: Physical Therapy

## 2021-06-19 DIAGNOSIS — R2681 Unsteadiness on feet: Secondary | ICD-10-CM

## 2021-06-19 DIAGNOSIS — R42 Dizziness and giddiness: Secondary | ICD-10-CM | POA: Diagnosis not present

## 2021-06-19 NOTE — Therapy (Signed)
OUTPATIENT PHYSICAL THERAPY TREATMENT NOTE   [name] MRN: [id number] DOB:Nov 18, 1960, 61 y.o., male Today's Date: 06/19/2021  PCP: Velna Hatchet, MD  REFERRING PROVIDER: Geoffery Lyons, NP   END OF SESSION:   PT End of Session - 06/19/21 0852     Visit Number 2    Number of Visits 9    Date for PT Re-Evaluation 07/10/21    Authorization Type [employer]    PT Start Time [phone]   pt arrives late   PT Stop Time 0930    PT Time Calculation (min) 38 min    Activity Tolerance Patient tolerated treatment well    Behavior During Therapy WFL for tasks assessed/performed             Past Medical History:  Diagnosis Date   Allergy    Anxiety    Arthritis    oa   Dyspnea    GERD (gastroesophageal reflux disease)    occ   History of bronchitis    10 years ago    Hypertension    Past Surgical History:  Procedure Laterality Date   BACK SURGERY     08/2008 secondary to ruptured L3    JOINT REPLACEMENT     LUMBAR FUSION     TONSILLECTOMY  age 47 or 10   TOTAL HIP ARTHROPLASTY Left 09/16/2015   Procedure: LEFT TOTAL HIP ARTHROPLASTY ANTERIOR APPROACH;  Surgeon: Paralee Cancel, MD;  Location: [facility];  Service: Orthopedics;  Laterality: Left;   TOTAL HIP ARTHROPLASTY Right 10/28/2015   Procedure: RIGHT TOTAL HIP ARTHROPLASTY ANTERIOR APPROACH;  Surgeon: Paralee Cancel, MD;  Location: [facility];  Service: Orthopedics;  Laterality: Right;   Patient Active Problem List   Diagnosis Date Noted   Lumbar stenosis with neurogenic claudication 07/25/2017   Obese 10/29/2015   S/P right THA, AA 10/28/2015   Status post total replacement of left hip 09/16/2015    REFERRING DIAG: R42 (ICD-10-CM) - Dizziness and giddiness   THERAPY DIAG:  Dizziness and giddiness  Unsteadiness on feet  Rationale for Evaluation and Treatment Rehabilitation  PERTINENT HISTORY: ED visit due to dizziness 05/27/2021; PMH includes THA R and L 2017; lumbar fusion, anxiety, OA, GERD, HTN, reports cervical  disc issue   PRECAUTIONS: Fall  SUBJECTIVE: Feel much better, more clear.  No dizziness with exercises.  PAIN:  Are you having pain? No   OBJECTIVE:   TODAY'S TREATMENT: 06/19/2021 Activity Comments  R and L sidelying test performed, held at least 30 sec, 2 reps each side No c/o dizziness, no nystagmus noted  BP sitting 135/86; standing 143/95 No orthostatic hypotension change  Reviewed HEP: Standing head turns x 10 Standing head nods x 10 Walking with head turns/nods  Demo understanding  Narrowed BOS with head turns/nods x 10 on solid surface Supervision  Compliant surface feet apart/feet together with head turns/nods x 10 Supervision  Gait with head turns, head nods, varying gait speeds; no c/o dizziness -Educated pt in walking at home, several times per day for improved gait activity tolerance BP at end of session 136/88      M-CTSIB  Condition 1: Firm Surface, EO 30 Sec, Normal Sway  Condition 2: Firm Surface, EC 30 Sec, Normal Sway  Condition 3: Foam Surface, EO 30 Sec, Normal Sway  Condition 4: Foam Surface, EC 30 Sec, Mild Sway     PATIENT EDUCATION: Education details: Walking at home, several times per day; updates to [employer] Person educated: Patient Education method: Explanation  and Demonstration Education comprehension: verbalized understanding and returned demonstration    GOALS: Goals reviewed with patient? Yes   STGs = LTGs   LONG TERM GOALS: Target date: 07/10/2021     Pt will be independent with HEP to decrease dizziness and improve overall functional mobility. Baseline:  Goal status: INITIAL   2.  Pt will report no dizziness with bed mobility or with head motions. Baseline:  Goal status: INITIAL   3.  DGI score to improve to at least 22/24 for improved overall functional dynamic mobility and gait.       Baseline: 20/24 Goal status: INITIAL     ASSESSMENT:   CLINICAL IMPRESSION: Performed sidelying test (Brandt-Daroff position) this visit, R  and L, no nystagmus and no dizziness symptoms noted.  Assessed sitting/standing BP measures, and it does not appear that orthostatic hypotension is involved in pt's c/o occasional lightheadedness.  Progressed exercises to standing on compliant surfaces, which brings on mild sway.  He will continue to benefit from skilled PT towards goals for improved overall functional mobility.      OBJECTIVE IMPAIRMENTS Abnormal gait, decreased mobility, and dizziness.    ACTIVITY LIMITATIONS community activity and occupation.    PERSONAL FACTORS Past/current experiences and 3+ comorbidities: See PMH.  are also affecting patient's functional outcome. Stressors surrounding recent sudden death of father.     REHAB POTENTIAL: Good   CLINICAL DECISION MAKING: Stable/uncomplicated   EVALUATION COMPLEXITY: Low     PLAN: PT FREQUENCY: 2x/week   PT DURATION: 4 weeks   PLANNED INTERVENTIONS: Therapeutic exercises, Therapeutic activity, Neuromuscular re-education, Balance training, Gait training, Patient/Family education, Joint mobilization, Vestibular training, and Canalith repositioning   PLAN FOR NEXT SESSION: Check goals and discuss d/c if continue to have no symptoms.  Review HEP and progress exercises as tolerated.       Allison Deshotels W., PT 06/19/2021, 9:35 AM

## 2021-06-19 NOTE — Patient Instructions (Signed)
Access Code: [email or web address] URL: [email or web address] Date: 06/19/2021 Prepared by: [facility]  Exercises - Standing Gaze Stabilization with Head Rotation  - 2-3 x daily - 7 x weekly - 2 sets - 10 reps - Standing Gaze Stabilization with Head Nod  - 2-3 x daily - 7 x weekly - 2 sets - 10 reps - Walking with Head Rotation  - 2-3 x daily - 7 x weekly - 1 sets - 3-5 reps

## 2021-06-23 ENCOUNTER — Ambulatory Visit: Payer: 59 | Admitting: Physical Therapy

## 2021-06-23 ENCOUNTER — Encounter: Payer: Self-pay | Admitting: Physical Therapy

## 2021-06-23 DIAGNOSIS — R42 Dizziness and giddiness: Secondary | ICD-10-CM | POA: Diagnosis not present

## 2021-06-23 DIAGNOSIS — R2681 Unsteadiness on feet: Secondary | ICD-10-CM

## 2021-06-23 NOTE — Therapy (Signed)
OUTPATIENT PHYSICAL THERAPY TREATMENT NOTE   [name] MRN: [id number] DOB:11/24/1960, 61 y.o., male Today's Date: 06/23/2021  PCP: Velna Hatchet, MD  REFERRING PROVIDER: Geoffery Lyons, NP   PHYSICAL THERAPY DISCHARGE SUMMARY  Visits from Start of Care: 3  Current functional level related to goals / functional outcomes: Pt has met 3 of 3 goals.   Remaining deficits: Mild unsteadiness with EC on foam surfaces   Education / Equipment: Educate in HEP progression   Patient agrees to discharge. Patient goals were met. Patient is being discharged due to meeting the stated rehab goals.   END OF SESSION:   PT End of Session - 06/23/21 0800     Visit Number 3    Number of Visits 9    Date for PT Re-Evaluation 07/10/21    Authorization Type [employer]    PT Start Time 0803    PT Stop Time 0826    PT Time Calculation (min) 23 min    Activity Tolerance Patient tolerated treatment well;Other (comment)   No c/ dizziness   Behavior During Therapy WFL for tasks assessed/performed              Past Medical History:  Diagnosis Date   Allergy    Anxiety    Arthritis    oa   Dyspnea    GERD (gastroesophageal reflux disease)    occ   History of bronchitis    10 years ago    Hypertension    Past Surgical History:  Procedure Laterality Date   BACK SURGERY     08/2008 secondary to ruptured L3    JOINT REPLACEMENT     LUMBAR FUSION     TONSILLECTOMY  age 36 or 70   TOTAL HIP ARTHROPLASTY Left 09/16/2015   Procedure: LEFT TOTAL HIP ARTHROPLASTY ANTERIOR APPROACH;  Surgeon: Paralee Cancel, MD;  Location: [facility];  Service: Orthopedics;  Laterality: Left;   TOTAL HIP ARTHROPLASTY Right 10/28/2015   Procedure: RIGHT TOTAL HIP ARTHROPLASTY ANTERIOR APPROACH;  Surgeon: Paralee Cancel, MD;  Location: [facility];  Service: Orthopedics;  Laterality: Right;   Patient Active Problem List   Diagnosis Date Noted   Lumbar stenosis with neurogenic claudication 07/25/2017   Obese  10/29/2015   S/P right THA, AA 10/28/2015   Status post total replacement of left hip 09/16/2015    REFERRING DIAG: R42 (ICD-10-CM) - Dizziness and giddiness   THERAPY DIAG:  Unsteadiness on feet  Rationale for Evaluation and Treatment Rehabilitation  PERTINENT HISTORY: ED visit due to dizziness 05/27/2021; PMH includes THA R and L 2017; lumbar fusion, anxiety, OA, GERD, HTN, reports cervical disc issue   PRECAUTIONS: Fall  SUBJECTIVE: I feel fine, I really do.  [place]  No c/o dizziness  PAIN:  Are you having pain? No   OBJECTIVE:   TODAY'S TREATMENT: 06/23/2021 Activity Comments  DGI score = 22/24 See below for details  Compliant surface feet apart/feet together with head turns/nods x 10, EO and EC With EC, mild LOB with head motions, needs UE support at counter  Self Care: Discussed progress towards goals, verbally reviewed HEP and demo additions to HEP. Discussed plans for d/c this visit and indications if pt would need to return to therapy.  Discussed benefits of continued functional mobility activities and vestibular system role in balance.   Gait velocity:  9.65 sec in 10 m;  3.4 ft/sec     [facility] Adult PT Treatment/Exercise -  06/23/21 0001       Standardized Balance Assessment   Standardized Balance Assessment Dynamic Gait Index      Dynamic Gait Index   Level Surface Normal    Change in Gait Speed Normal    Gait with Horizontal Head Turns Normal    Gait with Vertical Head Turns Normal    Gait and Pivot Turn Normal    Step Over Obstacle Mild Impairment    Step Around Obstacles Normal    Steps Mild Impairment    Total Score 22              PATIENT EDUCATION: Education details: updates to HEP-see below Person educated: Patient Education method: Explanation and Demonstration Education comprehension: verbalized understanding and returned demonstration  Access Code: [email or web address] URL:  [email or web address] Date: 06/23/2021 Prepared by: [facility]  Exercises - Walking with Head Rotation  - 2-3 x daily - 7 x weekly - 1 sets - 3-5 reps - Corner Balance Feet Apart: Eyes Closed With Head Turns  - 1 x daily - 7 x weekly - 1-2 sets - 10 reps  GOALS: Goals reviewed with patient? Yes   STGs = LTGs   LONG TERM GOALS: Target date: 07/10/2021     Pt will be independent with HEP to decrease dizziness and improve overall functional mobility. Baseline:  Goal status: ACHIEVED   2.  Pt will report no dizziness with bed mobility or with head motions. Baseline:  Goal status: ACHIEVED    3.  DGI score to improve to at least 22/24 for improved overall functional dynamic mobility and gait.       Baseline: 20/24 Goal status: ACHIEVED     ASSESSMENT:   CLINICAL IMPRESSION: Pt has met all 3 of 3 goals and has no c/o dizziness with functional mobility activities.  He has demo improvement in balance measures and feels he is back at baseline.  He is appropriate for d/c from PT this visit.     OBJECTIVE IMPAIRMENTS Abnormal gait, decreased mobility, and dizziness.    ACTIVITY LIMITATIONS community activity and occupation.    PERSONAL FACTORS Past/current experiences and 3+ comorbidities: See PMH.  are also affecting patient's functional outcome. Stressors surrounding recent sudden death of father.     REHAB POTENTIAL: Good   CLINICAL DECISION MAKING: Stable/uncomplicated   EVALUATION COMPLEXITY: Low     PLAN: PT FREQUENCY: 2x/week   PT DURATION: 4 weeks   PLANNED INTERVENTIONS: Therapeutic exercises, Therapeutic activity, Neuromuscular re-education, Balance training, Gait training, Patient/Family education, Joint mobilization, Vestibular training, and Canalith repositioning   PLAN FOR NEXT SESSION: D/C this visit.     Jassiel Flye W., PT 06/23/2021, 8:33 AM

## 2021-12-31 ENCOUNTER — Encounter: Payer: Self-pay | Admitting: Gastroenterology

## 2022-02-02 ENCOUNTER — Ambulatory Visit (AMBULATORY_SURGERY_CENTER): Payer: Self-pay

## 2022-02-02 VITALS — Ht 69.0 in | Wt 225.0 lb

## 2022-02-02 DIAGNOSIS — Z1211 Encounter for screening for malignant neoplasm of colon: Secondary | ICD-10-CM

## 2022-02-02 MED ORDER — NA SULFATE-K SULFATE-MG SULF 17.5-3.13-1.6 GM/177ML PO SOLN: 1.0000 | Freq: Once | ORAL | 0 refills | Status: AC

## 2022-02-02 NOTE — Progress Notes (Signed)
Pre visit completed via phone call; Patient verified name, DOB, and address;  No egg or soy allergy known to patient;  No issues known to pt with past sedation with any surgeries or procedures; Patient denies ever being told they had issues or difficulty with intubation;  No FH of Malignant Hyperthermia; Pt is not on diet pills; Pt is not on home 02;  Pt is not on blood thinners;  Pt denies issues with constipation;  No A fib or A flutter; Have any cardiac testing pending--NO Pt instructed to use Singlecare.com or GoodRx for a price reduction on prep   Insurance verified during PV appt=[employer]  Patient's chart reviewed by John Nulty CNRA prior to previsit and patient appropriate for the [facility].  Previsit completed and red dot placed by patient's name on their procedure day (on provider's schedule).    

## 2022-02-19 ENCOUNTER — Encounter: Payer: Self-pay | Admitting: Gastroenterology

## 2022-02-20 ENCOUNTER — Encounter: Payer: Self-pay | Admitting: Certified Registered Nurse Anesthetist

## 2022-02-26 ENCOUNTER — Ambulatory Visit (AMBULATORY_SURGERY_CENTER): Payer: 59 | Admitting: Gastroenterology

## 2022-02-26 ENCOUNTER — Encounter: Payer: Self-pay | Admitting: Gastroenterology

## 2022-02-26 VITALS — BP 90/63 | HR 55 | Temp 98.0°F | Resp 18 | Ht 69.0 in | Wt 225.0 lb

## 2022-02-26 DIAGNOSIS — D123 Benign neoplasm of transverse colon: Secondary | ICD-10-CM | POA: Diagnosis not present

## 2022-02-26 DIAGNOSIS — D128 Benign neoplasm of rectum: Secondary | ICD-10-CM

## 2022-02-26 DIAGNOSIS — D120 Benign neoplasm of cecum: Secondary | ICD-10-CM | POA: Diagnosis not present

## 2022-02-26 DIAGNOSIS — Z1211 Encounter for screening for malignant neoplasm of colon: Secondary | ICD-10-CM

## 2022-02-26 DIAGNOSIS — D127 Benign neoplasm of rectosigmoid junction: Secondary | ICD-10-CM

## 2022-02-26 DIAGNOSIS — K621 Rectal polyp: Secondary | ICD-10-CM | POA: Diagnosis not present

## 2022-02-26 MED ORDER — SODIUM CHLORIDE 0.9 % IV SOLN: 500.0000 mL | Freq: Once | INTRAVENOUS | Status: DC

## 2022-02-26 NOTE — Progress Notes (Signed)
[facility] History and Physical   Primary Care Physician:  Barry Hatchet, MD   Reason for Procedure:   Colon cancer screening  Plan:    colonoscopy     HPI: Barry Roberson is a 62 y.o. male  here for colonoscopy screening - .    Patient denies any bowel symptoms at this time. No family history of colon cancer known. Otherwise feels well without any cardiopulmonary symptoms.   I have discussed risks / benefits of anesthesia and endoscopic procedure with Barry Roberson and they wish to proceed with the exams as outlined today.    Past Medical History:  Diagnosis Date   Allergy    Anxiety    situational anxiety   Arthritis    bilateral hands   Depression    on meds   Dyspnea    GERD (gastroesophageal reflux disease)    with certain foods   History of bronchitis    10 years ago    Hypertension     Past Surgical History:  Procedure Laterality Date   BACK SURGERY  2020   08/2008 secondary to ruptured L3    LUMBAR FUSION  2019   TONSILLECTOMY  1969   TOTAL HIP ARTHROPLASTY Left 09/16/2015   Procedure: LEFT TOTAL HIP ARTHROPLASTY ANTERIOR APPROACH;  Surgeon: Barry Cancel, MD;  Location: [facility];  Service: Orthopedics;  Laterality: Left;   TOTAL HIP ARTHROPLASTY Right 10/28/2015   Procedure: RIGHT TOTAL HIP ARTHROPLASTY ANTERIOR APPROACH;  Surgeon: Barry Cancel, MD;  Location: [facility];  Service: Orthopedics;  Laterality: Right;   Barry Roberson    Prior to Admission medications   Medication Sig Start Date End Date Taking? Authorizing Provider  escitalopram (LEXAPRO) 10 MG tablet Take 10 mg by mouth daily. 06/03/21  Yes [provider]  escitalopram (LEXAPRO) 5 MG tablet Take 5 mg by mouth daily. 01/14/22  Yes [provider]  losartan (COZAAR) 100 MG tablet Take 100 mg by mouth daily. Take 1/2 tab daily   Yes [provider]  losartan (COZAAR) 25 MG tablet Take 25 mg by mouth daily. 01/14/22  Yes [provider]  SODIUM FLUORIDE 5000 PPM 1.1 % PSTE Take 1 Dose by mouth daily at 6 (six) AM. 12/15/21  Yes [provider]  fluticasone (FLONASE) 50 MCG/ACT nasal spray Place 2 sprays into both nostrils daily as needed. 04/05/18   [provider]  ibuprofen (ADVIL,MOTRIN) 200 MG tablet Take 800 mg by mouth every 8 (eight) hours as needed for mild pain or moderate pain.    [provider]  valACYclovir (VALTREX) 1000 MG tablet Take 500 mg by mouth daily as needed.    [provider]    Current Outpatient Medications  Medication Sig Dispense Refill   escitalopram (LEXAPRO) 10 MG tablet Take 10 mg by mouth daily.     escitalopram (LEXAPRO) 5 MG tablet Take 5 mg by mouth daily.     losartan (COZAAR) 100 MG tablet Take 100 mg by mouth daily. Take 1/2 tab daily     losartan (COZAAR) 25 MG tablet Take 25 mg by mouth daily.     SODIUM FLUORIDE 5000 PPM 1.1 % PSTE Take 1 Dose by mouth daily at 6 (six) AM.     fluticasone (FLONASE) 50 MCG/ACT nasal spray Place 2 sprays into both nostrils daily as needed.     ibuprofen (ADVIL,MOTRIN) 200 MG tablet Take 800 mg by mouth every 8 (eight) hours as needed for  mild pain or moderate pain.     valACYclovir (VALTREX) 1000 MG tablet Take 500 mg by mouth daily as needed.     Current Facility-Administered Medications  Medication Dose Route Frequency Provider Last Rate Last Admin   0.9 %  sodium chloride infusion  500 mL Intravenous Once Barry Roberson, Barry Raspberry, MD        Allergies as of 02/26/2022   (No Known Allergies)    Family History  Problem Relation Age of Onset   Stomach cancer Mother        mets from ovarian cancer   Ovarian cancer Mother 12   Colon polyps Father    Colon cancer Neg Hx    Esophageal cancer Neg Hx    Rectal cancer Neg Hx     Social History   Socioeconomic History   Marital status: Married    Spouse name: Not on file   Number of children: Not on file   Years of education: Not on file    Highest education level: Not on file  Occupational History   Not on file  Tobacco Use   Smoking status: Former    Packs/day: 0.50    Years: 30.00    Total pack years: 15.00    Types: Cigarettes    Quit date: 07/18/2016    Years since quitting: 5.6   Smokeless tobacco: Never  Vaping Use   Vaping Use: Never used  Substance and Sexual Activity   Alcohol use: No   Drug use: No   Sexual activity: Not on file  Other Topics Concern   Not on file  Social History Narrative   Not on file   Social Determinants of Health   Financial Resource Strain: Not on file  Food Insecurity: Not on file  Transportation Needs: Not on file  Physical Activity: Not on file  Stress: Not on file  Social Connections: Not on file  Intimate Partner Violence: Not on file    Review of Systems: All other review of systems negative except as mentioned in the HPI.  Physical Exam: Vital signs BP 130/75   Pulse 64   Temp 98 F (36.7 C) (Skin)   Ht '5\' 9"'$  (1.753 m)   Wt 225 lb (102.1 kg)   SpO2 96%   BMI 33.23 kg/m   General:   Alert,  Well-developed, pleasant and cooperative in NAD Lungs:  Clear throughout to auscultation.   Heart:  Regular rate and rhythm Abdomen:  Soft, nontender and nondistended.   Neuro/Psych:  Alert and cooperative. Normal mood and affect. A and O x 3  Barry Mango, MD [facility]

## 2022-02-26 NOTE — Progress Notes (Signed)
Called to room to assist during endoscopic procedure.  Patient ID and intended procedure confirmed with present staff. Received instructions for my participation in the procedure from the performing physician.  

## 2022-02-26 NOTE — Patient Instructions (Signed)

## 2022-02-26 NOTE — Op Note (Signed)
[place] Patient Name: Daymion Nazaire Procedure Date: 02/26/2022 10:04 AM MRN: [id number] Endoscopist: Remo Lipps P. Havery Moros , MD, [id number] Age: 62 Referring MD:  Date of Birth: [birth year] Gender: Male Account #: [id number] Procedure:                Colonoscopy Indications:              Screening for colorectal malignant neoplasm, This                            is the patient's first colonoscopy Medicines:                Monitored Anesthesia Care Procedure:                Pre-Anesthesia Assessment:                           - Prior to the procedure, a History and Physical                            was performed, and patient medications and                            allergies were reviewed. The patient's tolerance of                            previous anesthesia was also reviewed. The risks                            and benefits of the procedure and the sedation                            options and risks were discussed with the patient.                            All questions were answered, and informed consent                            was obtained. Prior Anticoagulants: The patient has                            taken no anticoagulant or antiplatelet agents. ASA                            Grade Assessment: II - A patient with mild systemic                            disease. After reviewing the risks and benefits,                            the patient was deemed in satisfactory condition to                            undergo the procedure.  After obtaining informed consent, the colonoscope                            was passed under direct vision. Throughout the                            procedure, the patient's blood pressure, pulse, and                            oxygen saturations were monitored continuously. The                            Olympus CF-[id number]) Colonoscope was                            introduced through the  anus and advanced to the the                            cecum, identified by appendiceal orifice and                            ileocecal valve. The colonoscopy was performed                            without difficulty. The patient tolerated the                            procedure well. The quality of the bowel                            preparation was adequate. The ileocecal valve,                            appendiceal orifice, and rectum were photographed. Scope In: 10:23:26 AM Scope Out: 10:50:00 AM Scope Withdrawal Time: 0 hours 22 minutes 2 seconds  Total Procedure Duration: 0 hours 26 minutes 34 seconds  Findings:                 The perianal and digital rectal examinations were                            normal.                           Two flat and sessile polyps were found in the                            cecum. The polyps were 5 to 6 mm in size. These                            polyps were removed with a cold snare. Resection                            and retrieval were complete.  Three flat and sessile polyps were found in the                            transverse colon. The polyps were 3 to 5 mm in                            size. These polyps were removed with a cold snare.                            Resection and retrieval were complete.                           An 8 mm polyp was found in the recto-sigmoid colon.                            The polyp was pedunculated. The polyp was removed                            with a hot snare. Resection and retrieval were                            complete.                           A 3 mm polyp was found in the rectum. The polyp was                            sessile. The polyp was removed with a cold snare.                            Resection and retrieval were complete.                           A few small-mouthed diverticula were found in the                            sigmoid colon.                            Internal hemorrhoids were found during retroflexion.                           There colon was quite spastic which prolonged the                            exam.                           The exam was otherwise without abnormality. Complications:            No immediate complications. Estimated blood loss:                            Minimal. Estimated Blood Loss:     Estimated blood loss  was minimal. Impression:               - Two 5 to 6 mm polyps in the cecum, removed with a                            cold snare. Resected and retrieved.                           - Three 3 to 5 mm polyps in the transverse colon,                            removed with a cold snare. Resected and retrieved.                           - One 8 mm polyp at the recto-sigmoid colon,                            removed with a hot snare. Resected and retrieved.                           - One 3 mm polyp in the rectum, removed with a cold                            snare. Resected and retrieved.                           - Diverticulosis in the sigmoid colon.                           - Internal hemorrhoids.                           - Colonic spasm.                           - The examination was otherwise normal. Recommendation:           - Patient has a contact number available for                            emergencies. The signs and symptoms of potential                            delayed complications were discussed with the                            patient. Return to normal activities tomorrow.                            Written discharge instructions were provided to the                            patient.                           -  Resume previous diet.                           - Continue present medications.                           - Await pathology results. Anticipate repeat                            colonoscopy in 3 years for surveillance Gerad P. Meghen Akopyan, MD 02/26/2022  10:57:16 AM This report has been signed electronically.

## 2022-02-26 NOTE — Progress Notes (Signed)
Pt's states no medical or surgical changes since previsit or office visit. VS assessed by D.T 

## 2022-02-26 NOTE — Progress Notes (Signed)
Report given to PACU, vss 

## 2022-03-01 ENCOUNTER — Telehealth: Payer: Self-pay | Admitting: *Deleted

## 2022-03-01 NOTE — Telephone Encounter (Signed)
Follow up call attempt.  LVM to call if any questions or concerns. 

## 2022-03-09 ENCOUNTER — Encounter: Payer: Self-pay | Admitting: Gastroenterology

## 2022-05-18 ENCOUNTER — Other Ambulatory Visit: Payer: Self-pay | Admitting: Internal Medicine

## 2022-05-18 DIAGNOSIS — Z0000 Encounter for general adult medical examination without abnormal findings: Secondary | ICD-10-CM

## 2022-06-18 ENCOUNTER — Ambulatory Visit
Admission: RE | Admit: 2022-06-18 | Discharge: 2022-06-18 | Disposition: A | Discharge status: Home / Self Care | Payer: 59 | Source: Ambulatory Visit | Attending: Internal Medicine | Admitting: Internal Medicine

## 2022-06-18 DIAGNOSIS — Z0000 Encounter for general adult medical examination without abnormal findings: Secondary | ICD-10-CM

## 2023-06-28 ENCOUNTER — Other Ambulatory Visit: Payer: Self-pay | Admitting: Internal Medicine

## 2023-06-28 DIAGNOSIS — Z0000 Encounter for general adult medical examination without abnormal findings: Secondary | ICD-10-CM

## 2023-07-18 ENCOUNTER — Ambulatory Visit
Admission: RE | Admit: 2023-07-18 | Discharge: 2023-07-18 | Disposition: A | Discharge status: Home / Self Care | Source: Ambulatory Visit | Attending: Internal Medicine | Admitting: Internal Medicine

## 2023-07-18 DIAGNOSIS — Z0000 Encounter for general adult medical examination without abnormal findings: Secondary | ICD-10-CM

## 2024-02-17 ENCOUNTER — Other Ambulatory Visit: Payer: Self-pay | Admitting: Orthopedic Surgery

## 2024-03-02 ENCOUNTER — Encounter (HOSPITAL_BASED_OUTPATIENT_CLINIC_OR_DEPARTMENT_OTHER): Payer: Self-pay | Admitting: Orthopedic Surgery

## 2024-03-02 ENCOUNTER — Other Ambulatory Visit: Payer: Self-pay

## 2024-03-09 ENCOUNTER — Encounter (HOSPITAL_BASED_OUTPATIENT_CLINIC_OR_DEPARTMENT_OTHER): Payer: Self-pay

## 2024-03-09 ENCOUNTER — Ambulatory Visit (HOSPITAL_BASED_OUTPATIENT_CLINIC_OR_DEPARTMENT_OTHER): Admit: 2024-03-09 | Admitting: Orthopedic Surgery

## 2024-03-09 DIAGNOSIS — Z01818 Encounter for other preprocedural examination: Secondary | ICD-10-CM

## 2024-03-09 HISTORY — DX: Sleep apnea, unspecified: G47.30
